# Patient Record
Sex: Female | Born: 1950
Health system: Southern US, Community
[De-identification: ages and names within clinical notes are randomized; demographics above are authoritative.]

## PROBLEM LIST (undated history)

## (undated) DIAGNOSIS — IMO0001 Reserved for inherently not codable concepts without codable children: Secondary | ICD-10-CM

## (undated) DIAGNOSIS — I10 Essential (primary) hypertension: Secondary | ICD-10-CM

## (undated) DIAGNOSIS — Z923 Personal history of irradiation: Secondary | ICD-10-CM

## (undated) DIAGNOSIS — M1990 Unspecified osteoarthritis, unspecified site: Secondary | ICD-10-CM

## (undated) DIAGNOSIS — IMO0002 Reserved for concepts with insufficient information to code with codable children: Secondary | ICD-10-CM

## (undated) DIAGNOSIS — C50211 Malignant neoplasm of upper-inner quadrant of right female breast: Secondary | ICD-10-CM

## (undated) DIAGNOSIS — G629 Polyneuropathy, unspecified: Secondary | ICD-10-CM

## (undated) HISTORY — DX: Malignant neoplasm of upper-inner quadrant of right female breast: C50.211

## (undated) HISTORY — PX: SHOULDER SURGERY: SHX246

## (undated) HISTORY — PX: FOOT SURGERY: SHX648

## (undated) HISTORY — DX: Essential (primary) hypertension: I10

## (undated) HISTORY — DX: Unspecified osteoarthritis, unspecified site: M19.90

## (undated) HISTORY — DX: Reserved for inherently not codable concepts without codable children: IMO0001

## (undated) HISTORY — DX: Reserved for concepts with insufficient information to code with codable children: IMO0002

---

## 2008-09-29 ENCOUNTER — Encounter: Admission: RE | Admit: 2008-09-29 | Discharge: 2008-09-29 | Payer: Self-pay | Admitting: Internal Medicine

## 2008-11-25 ENCOUNTER — Emergency Department (HOSPITAL_COMMUNITY): Admission: EM | Admit: 2008-11-25 | Discharge: 2008-11-25 | Payer: Self-pay | Admitting: Emergency Medicine

## 2010-02-19 ENCOUNTER — Encounter: Payer: Self-pay | Admitting: Internal Medicine

## 2010-05-03 LAB — BASIC METABOLIC PANEL
BUN: 11 mg/dL (ref 6–23)
CO2: 26 mEq/L (ref 19–32)
Calcium: 9.8 mg/dL (ref 8.4–10.5)
Chloride: 104 mEq/L (ref 96–112)
Creatinine, Ser: 0.77 mg/dL (ref 0.4–1.2)
GFR calc Af Amer: >60 mL/min (ref >60)
GFR calc non Af Amer: >60 mL/min (ref >60)
Glucose, Bld: 129 mg/dL — ABNORMAL HIGH (ref 70–99)
Potassium: 3.4 mEq/L — ABNORMAL LOW (ref 3.5–5.1)
Sodium: 138 mEq/L (ref 135–145)

## 2010-05-03 LAB — DIFFERENTIAL
Basophils Absolute: 0 10*3/uL (ref 0.0–0.1)
Basophils Relative: 0 % (ref 0–1)
Eosinophils Absolute: 0 10*3/uL (ref 0.0–0.7)
Eosinophils Relative: 0 % (ref 0–5)
Lymphocytes Relative: 7 % — ABNORMAL LOW (ref 12–46)
Lymphs Abs: 0.5 10*3/uL — ABNORMAL LOW (ref 0.7–4.0)
Monocytes Absolute: 0.1 10*3/uL (ref 0.1–1.0)
Monocytes Relative: 2 % — ABNORMAL LOW (ref 3–12)
Neutro Abs: 6.8 10*3/uL (ref 1.7–7.7)
Neutrophils Relative %: 92 % — ABNORMAL HIGH (ref 43–77)

## 2010-05-03 LAB — CBC
HCT: 41.3 % (ref 36.0–46.0)
Hemoglobin: 14 g/dL (ref 12.0–15.0)
MCHC: 33.9 g/dL (ref 30.0–36.0)
MCV: 91.3 fL (ref 78.0–100.0)
Platelets: 196 10*3/uL (ref 150–400)
RBC: 4.53 MIL/uL (ref 3.87–5.11)
RDW: 13.7 % (ref 11.5–15.5)
WBC: 7.4 10*3/uL (ref 4.0–10.5)

## 2010-05-03 LAB — POCT CARDIAC MARKERS
CKMB, poc: <1 ng/mL — ABNORMAL LOW (ref 1.0–8.0)
Myoglobin, poc: 50.1 ng/mL (ref 12–200)
Troponin i, poc: <0.05 ng/mL (ref 0.00–0.09)

## 2014-09-09 ENCOUNTER — Ambulatory Visit (INDEPENDENT_AMBULATORY_CARE_PROVIDER_SITE_OTHER): Payer: Medicare Other | Admitting: Physician Assistant

## 2014-09-09 VITALS — BP 118/74 | HR 88 | Temp 98.5°F | Resp 16 | Ht 62.0 in | Wt 187.4 lb

## 2014-09-09 DIAGNOSIS — Z111 Encounter for screening for respiratory tuberculosis: Secondary | ICD-10-CM

## 2014-09-09 NOTE — Progress Notes (Signed)
Patient here for TB screen only. Denies a history of positive screen.  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  Philis Fendt, MS, PA-C   10:50 PM, 09/09/2014

## 2014-09-09 NOTE — Progress Notes (Signed)

## 2014-09-12 ENCOUNTER — Ambulatory Visit (INDEPENDENT_AMBULATORY_CARE_PROVIDER_SITE_OTHER): Payer: Medicare Other

## 2014-09-12 DIAGNOSIS — Z111 Encounter for screening for respiratory tuberculosis: Secondary | ICD-10-CM

## 2014-09-12 LAB — TB SKIN TEST
Induration: 0 mm
TB Skin Test: NEGATIVE

## 2014-09-12 NOTE — Progress Notes (Signed)
   Subjective:    Patient ID: Brianna Jones, female    DOB: 02/13/1950, 64 y.o.   MRN: [id number]  HPI Pt. Was here for a PPD reading. Results negative. Induration 0. Pt. Was given a copy of her results for work.    Review of Systems     Objective:   Physical Exam        Assessment & Plan:

## 2014-12-01 ENCOUNTER — Other Ambulatory Visit: Payer: Self-pay

## 2014-12-01 DIAGNOSIS — Z1231 Encounter for screening mammogram for malignant neoplasm of breast: Secondary | ICD-10-CM

## 2014-12-19 ENCOUNTER — Ambulatory Visit
Admission: RE | Admit: 2014-12-19 | Discharge: 2014-12-19 | Disposition: A | Discharge status: Home / Self Care | Payer: Medicare Other | Source: Ambulatory Visit

## 2014-12-19 DIAGNOSIS — Z1231 Encounter for screening mammogram for malignant neoplasm of breast: Secondary | ICD-10-CM

## 2014-12-21 ENCOUNTER — Other Ambulatory Visit: Payer: Self-pay | Admitting: Internal Medicine

## 2014-12-21 DIAGNOSIS — R928 Other abnormal and inconclusive findings on diagnostic imaging of breast: Secondary | ICD-10-CM

## 2014-12-30 ENCOUNTER — Ambulatory Visit
Admission: RE | Admit: 2014-12-30 | Discharge: 2014-12-30 | Disposition: A | Discharge status: Home / Self Care | Payer: Medicare Other | Source: Ambulatory Visit | Attending: Internal Medicine | Admitting: Internal Medicine

## 2014-12-30 ENCOUNTER — Other Ambulatory Visit: Payer: Self-pay | Admitting: Internal Medicine

## 2014-12-30 DIAGNOSIS — R928 Other abnormal and inconclusive findings on diagnostic imaging of breast: Secondary | ICD-10-CM

## 2014-12-30 DIAGNOSIS — R921 Mammographic calcification found on diagnostic imaging of breast: Secondary | ICD-10-CM

## 2014-12-30 DIAGNOSIS — N6310 Unspecified lump in the right breast, unspecified quadrant: Secondary | ICD-10-CM

## 2015-01-06 ENCOUNTER — Ambulatory Visit
Admission: RE | Admit: 2015-01-06 | Discharge: 2015-01-06 | Disposition: A | Discharge status: Home / Self Care | Payer: Medicare Other | Source: Ambulatory Visit | Attending: Internal Medicine | Admitting: Internal Medicine

## 2015-01-06 ENCOUNTER — Other Ambulatory Visit: Payer: Self-pay | Admitting: Internal Medicine

## 2015-01-06 DIAGNOSIS — N6310 Unspecified lump in the right breast, unspecified quadrant: Secondary | ICD-10-CM

## 2015-01-06 DIAGNOSIS — R921 Mammographic calcification found on diagnostic imaging of breast: Secondary | ICD-10-CM

## 2015-01-11 ENCOUNTER — Encounter: Payer: Self-pay | Admitting: *Deleted

## 2015-01-11 ENCOUNTER — Telehealth: Payer: Self-pay | Admitting: *Deleted

## 2015-01-11 DIAGNOSIS — C50211 Malignant neoplasm of upper-inner quadrant of right female breast: Secondary | ICD-10-CM | POA: Insufficient documentation

## 2015-01-11 HISTORY — DX: Malignant neoplasm of upper-inner quadrant of right female breast: C50.211

## 2015-01-11 NOTE — Telephone Encounter (Signed)
Confirmed [facility] for 01/18/15 at 0830 .  Instructions and contact information given. 

## 2015-01-12 ENCOUNTER — Telehealth: Payer: Self-pay | Admitting: *Deleted

## 2015-01-12 NOTE — Telephone Encounter (Signed)
Mailed clinic packet to pt.  

## 2015-01-18 ENCOUNTER — Ambulatory Visit
Admission: RE | Admit: 2015-01-18 | Discharge: 2015-01-18 | Disposition: A | Discharge status: Home / Self Care | Payer: Medicare Other | Source: Ambulatory Visit | Attending: Radiation Oncology | Admitting: Radiation Oncology

## 2015-01-18 ENCOUNTER — Other Ambulatory Visit: Payer: Self-pay | Admitting: *Deleted

## 2015-01-18 ENCOUNTER — Encounter: Payer: Self-pay | Admitting: Skilled Nursing Facility1

## 2015-01-18 ENCOUNTER — Encounter: Payer: Self-pay | Admitting: *Deleted

## 2015-01-18 ENCOUNTER — Encounter: Payer: Self-pay | Admitting: Physical Therapy

## 2015-01-18 ENCOUNTER — Other Ambulatory Visit (HOSPITAL_BASED_OUTPATIENT_CLINIC_OR_DEPARTMENT_OTHER): Payer: Medicare Other

## 2015-01-18 ENCOUNTER — Other Ambulatory Visit: Payer: Self-pay | Admitting: Surgery

## 2015-01-18 ENCOUNTER — Encounter: Payer: Self-pay | Admitting: Nurse Practitioner

## 2015-01-18 ENCOUNTER — Encounter: Payer: Self-pay | Admitting: Hematology and Oncology

## 2015-01-18 ENCOUNTER — Ambulatory Visit (HOSPITAL_BASED_OUTPATIENT_CLINIC_OR_DEPARTMENT_OTHER): Payer: Medicare Other | Admitting: Hematology and Oncology

## 2015-01-18 ENCOUNTER — Ambulatory Visit: Payer: Medicare Other | Attending: Surgery | Admitting: Physical Therapy

## 2015-01-18 VITALS — BP 153/81 | HR 107 | Temp 98.3°F | Resp 20 | Ht 62.0 in | Wt 189.9 lb

## 2015-01-18 DIAGNOSIS — C50211 Malignant neoplasm of upper-inner quadrant of right female breast: Secondary | ICD-10-CM

## 2015-01-18 DIAGNOSIS — C50412 Malignant neoplasm of upper-outer quadrant of left female breast: Secondary | ICD-10-CM

## 2015-01-18 DIAGNOSIS — C50911 Malignant neoplasm of unspecified site of right female breast: Secondary | ICD-10-CM | POA: Diagnosis present

## 2015-01-18 DIAGNOSIS — C50912 Malignant neoplasm of unspecified site of left female breast: Secondary | ICD-10-CM | POA: Diagnosis present

## 2015-01-18 DIAGNOSIS — R293 Abnormal posture: Secondary | ICD-10-CM | POA: Insufficient documentation

## 2015-01-18 LAB — COMPREHENSIVE METABOLIC PANEL
ALT: 22 U/L (ref 0–55)
AST: 25 U/L (ref 5–34)
Albumin: 3.7 g/dL (ref 3.5–5.0)
Alkaline Phosphatase: 75 U/L (ref 40–150)
Anion Gap: 9 mEq/L (ref 3–11)
BUN: 11.8 mg/dL (ref 7.0–26.0)
CO2: 28 mEq/L (ref 22–29)
Calcium: 10 mg/dL (ref 8.4–10.4)
Chloride: 103 mEq/L (ref 98–109)
Creatinine: 0.9 mg/dL (ref 0.6–1.1)
EGFR: 83 mL/min/{1.73_m2} — ABNORMAL LOW (ref >90)
Glucose: 95 mg/dl (ref 70–140)
Potassium: 3.6 mEq/L (ref 3.5–5.1)
Sodium: 140 mEq/L (ref 136–145)
Total Bilirubin: 0.35 mg/dL (ref 0.20–1.20)
Total Protein: 7.8 g/dL (ref 6.4–8.3)

## 2015-01-18 LAB — CBC WITH DIFFERENTIAL/PLATELET
BASO%: 0.3 % (ref 0.0–2.0)
Basophils Absolute: 0 10*3/uL (ref 0.0–0.1)
EOS%: 2.9 % (ref 0.0–7.0)
Eosinophils Absolute: 0.2 10*3/uL (ref 0.0–0.5)
HCT: 42 % (ref 34.8–46.6)
HGB: 14 g/dL (ref 11.6–15.9)
LYMPH%: 44.4 % (ref 14.0–49.7)
MCH: 29.5 pg (ref 25.1–34.0)
MCHC: 33.3 g/dL (ref 31.5–36.0)
MCV: 88.4 fL (ref 79.5–101.0)
MONO#: 0.3 10*3/uL (ref 0.1–0.9)
MONO%: 4.8 % (ref 0.0–14.0)
NEUT#: 2.8 10*3/uL (ref 1.5–6.5)
NEUT%: 47.6 % (ref 38.4–76.8)
Platelets: 221 10*3/uL (ref 145–400)
RBC: 4.75 10*6/uL (ref 3.70–5.45)
RDW: 15 % — ABNORMAL HIGH (ref 11.2–14.5)
WBC: 5.9 10*3/uL (ref 3.9–10.3)
lymph#: 2.6 10*3/uL (ref 0.9–3.3)

## 2015-01-18 NOTE — Assessment & Plan Note (Signed)
RIGHT breast biopsy 01/06/2015: 12:30: Invasive lobular carcinoma grade 1, ER 95%, PR 0% insufficient for her to our Ki-67; LCIS with necrosis and calcifications; LEFT breast biopsy upper outer quadrant: DCIS with necrosis and calcifications, ER 100%, PR 80% Right breast mass at 12:30: 1 x 0.6 x 1.1 cm; left breast calcifications spanning 4 cm with a titer group measuring 5 mm, no pathologic lymph nodes Clinical stage: Tis N0 stage 0 in the left breast  Pathology review: I discussed with the patient the difference between DCIS and invasive breast cancer. It is considered a precancerous lesion. DCIS is classified as a 0. It is generally detected through mammograms as calcifications. We discussed the significance of grades and its impact on prognosis. We also discussed the importance of ER and PR receptors and their implications to adjuvant treatment options. Prognosis of DCIS dependence on grade, comedo necrosis. It is anticipated that if not treated, 20-30% of DCIS can develop into invasive breast cancer.  Recommendation: Breast MRI 1. Breast conserving surgeries bilaterally 2. Followed by adjuvant radiation therapy 3. Followed by antiestrogen therapy with tamoxifen 5 years  Tamoxifen counseling: We discussed the risks and benefits of tamoxifen. These include but not limited to insomnia, hot flashes, mood changes, vaginal dryness, and weight gain. Although rare, serious side effects including endometrial cancer, risk of blood clots were also discussed. We strongly believe that the benefits far outweigh the risks. Patient understands these risks and consented to starting treatment. Planned treatment duration is 5 years.  Return to clinic after surgery to discuss the final pathology report and come up with an adjuvant treatment plan.

## 2015-01-18 NOTE — Progress Notes (Signed)
Subjective:     Patient ID: Brianna Jones, female   DOB: 01-Apr-1950, 64 y.o.   MRN: [id number]  HPI   Review of Systems     Objective:   Physical Exam For the patient to understand and be given the tools to implement a healthy plant based diet during their cancer diagnosis.     Assessment:     Patient was seen today and found to be in good spirits and accompanied by her daughter. Pts ht 5'2'', wt 189 pounds, and BMI 34.8. 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      Plan:     Dietitian educated the patient on implementing a plant based diet by incorporating more plant proteins, fruits, and vegetables. As a part of a healthy routine physical activity was discussed. The importance of legitimate, evidence based information was discussed and examples were given. A folder of evidence based information with a focus on a plant based diet and general nutrition during cancer was given to the patient.  As a part of the continuum of care the cancer dietitian's contact information was given to the patient in the event they would like to have a follow up appointment.

## 2015-01-18 NOTE — Progress Notes (Signed)
Ms. Brianna Jones is a very pleasant 64 y.o. 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  Biopsy results revealed the right sided tumor's prognostic profile is ER positive, PR negative, with insufficinet tissue to evaluate HER2/neu.  Left sided tumor is ER and PR positive.   She presents today with her daughter to the [facility]) for treatment consideration and recommendations from the breast surgeon, radiation oncologist, and medical oncologist.     I briefly met with Ms. Brianna Jones and her daughter during her [facility] visit today. We discussed the purpose of the Survivorship Clinic, which will include monitoring for recurrence, coordinating completion of age and gender-appropriate cancer screenings, promotion of overall wellness, as well as managing potential late/long-term side effects of anti-cancer treatments.    The treatment plan for Ms. Brianna Jones will likely include bilateral surgery, radiation therapy, and anti-estrogen therapy.  As of today, the intent of treatment for Ms. Brianna Jones is cure, therefore she will be eligible for the Survivorship Clinic upon her completion of treatment.  Her survivorship care plan (SCP) document will be drafted and updated throughout the course of her treatment trajectory. She will receive the SCP in an office visit with myself in the Survivorship Clinic once she has completed treatment.   Ms. Brianna Jones was encouraged to ask questions and all questions were answered to her satisfaction.  She was given my business card and encouraged to contact me with any concerns regarding survivorship.  I look forward to participating in her care.   Kenn File, [place] [phone]

## 2015-01-18 NOTE — Patient Instructions (Signed)

## 2015-01-18 NOTE — Therapy (Signed)
[place], [place], [address] Phone: [phone]   Fax:  [phone]  Physical Therapy Evaluation  Patient Details  Name: Brianna Jones MRN: [id number] Date of Birth: April 16, 1950 Referring Provider: Dr. Alphonsa Overall  Encounter Date: 01/18/2015      PT End of Session - 01/18/15 1135    Visit Number 1   Number of Visits 1   PT Start Time 1000   PT Stop Time 1013  Also saw pt from 1057-1110   PT Time Calculation (min) 13 min   Activity Tolerance Patient tolerated treatment well   Behavior During Therapy [facility] for tasks assessed/performed      Past Medical History  Diagnosis Date  . Hypertension   . Breast cancer of upper-inner quadrant of right female breast ([place]) 01/11/2015  . Arthritis     Past Surgical History  Procedure Laterality Date  . Shoulder surgery      There were no vitals filed for this visit.  Visit Diagnosis:  Bilateral malignant neoplasm of breast in female, unspecified site of breast ([place])  Abnormal posture      Subjective Assessment - 01/18/15 1135    Pertinent History Patient was diagnosed on 12/19/14 with bilateral breast cancer located in the upper outer quadrant.  Her right breast mass is invasive lobular carcinoma, measures 5 mm and is ER positive and PR negative.  Her left breast mass is high grade DCIS and is ER/PR positive.            [facility] PT Assessment - 01/18/15 0001    Assessment   Medical Diagnosis Bilateral breast cancer   Referring Provider Dr. Alphonsa Overall   Onset Date/Surgical Date 12/19/14   Hand Dominance Right   Prior Therapy none   Precautions   Precautions Other (comment)  Active breast cancer   Restrictions   Weight Bearing Restrictions No   Balance Screen   Has the patient fallen in the past 6 months No   Has the patient had a decrease in activity level because of a fear of falling?  No   Is the patient reluctant to leave their  home because of a fear of falling?  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 These included active assist shoulder flexion in sitting, scapular retraction, wall walking with shoulder abduction, and hands behind head external rotation.  She was encouraged to do these twice a day, holding 3 seconds and repeating 5 times when permitted by her physician.         PT Education - 01/18/15 1134    Education provided Yes   Education Details  Lymphedema risk reduction and post op shoulder ROM HEP   Person(s) Educated Patient   Methods Explanation;Demonstration;Handout   Comprehension Returned demonstration;Verbalized understanding              Breast Clinic Goals - February 08, 2015 May 21, 1215    Patient will be able to verbalize understanding of pertinent lymphedema risk reduction practices relevant to her diagnosis specifically related to skin care.   Time 1   Period Days   Status Achieved   Patient will be able to return demonstrate and/or verbalize understanding of the post-op home exercise program related to regaining shoulder range of motion.   Time 1   Period Days   Status Achieved   Patient will be able to verbalize  understanding of the importance of attending the postoperative After Breast Cancer Class for further lymphedema risk reduction education and therapeutic exercise.   Time 1   Period Days   Status Achieved              Plan - 02-08-2015 1136    Clinical Impression Statement Patient was diagnosed on 12/19/14 with bilateral breast cancer located in the upper outer quadrant.  Her right breast mass is invasive lobular carcinoma, measures 5 mm and is ER positive and PR negative.  Her left breast mass is high grade DCIS and is ER/PR positive.  She is planning to have a breast MRI.  She will likely undergo a bilateral lumpectomy with a bilateral sentinel node biopsy followed by bilateral radiation.  She will undergo anti-estrogen therapy.  She may benefit from post op PT to regain shoulder ROM and reduce lymphedema risk.   Pt will benefit from skilled therapeutic intervention in order to improve on the following deficits Decreased strength;Pain;Decreased knowledge of precautions;Impaired UE functional use;Decreased range of motion   Rehab Potential Excellent   Clinical Impairments Affecting Rehab Potential none   PT Frequency One time visit   PT Treatment/Interventions Therapeutic exercise;Patient/family education   PT Next Visit Plan N/A   Consulted and Agree with Plan of Care Patient     Patient will follow up at outpatient cancer rehab if needed following surgery.  If the patient requires physical therapy at that time, a specific plan will be dictated and sent to the referring physician for approval. The patient was educated today on appropriate basic range of motion exercises to begin post operatively and the importance of attending the After Breast Cancer class following surgery.  Patient was educated today on lymphedema risk reduction practices as it pertains to recommendations that will benefit the patient immediately following surgery.  She verbalized good understanding.  No additional physical  therapy is indicated at this time.         G-Codes - 02-08-2015 05/21/15    Functional Assessment Tool Used Clinical Judgement   Functional Limitation Other PT primary   Other PT Primary Current Status [id number]) At least 1 percent but less than 20 percent impaired, limited or restricted   Other PT Primary Goal Status [id number]) At least 1 percent but less than 20 percent impaired, limited or restricted   Other PT Primary Discharge Status [id number]) At least 1 percent but less than 20 percent impaired, limited or restricted       Problem List Patient Active Problem List   Diagnosis Date Noted  . Breast cancer of upper-outer quadrant of left female breast ([place]) Feb 08, 2015  . Breast cancer of upper-inner quadrant of right female breast ([place]) 01/11/2015    Annia Friendly, PT 2015-02-08 12:20  PM  [place], [place], [address] Phone: [phone]   Fax:  [phone]  Name: Brianna Jones MRN: [id number] Date of Birth: [birth year]

## 2015-01-18 NOTE — Progress Notes (Signed)
Radiation Oncology         [phone] ________________________________  Name: ALEXSYS ESKIN MRN: [id number]  Date: 01/18/2015  DOB: 03-11-1950     PJ:[email or web address],IWPYK N, MD  Alphonsa Overall, MD     REFERRING PHYSICIAN: Alphonsa Overall, MD  DIAGNOSIS: The primary encounter diagnosis was Breast cancer of upper-inner quadrant of right female breast ([place]). A diagnosis of Breast cancer of upper-outer quadrant of left female breast [facility]) was also pertinent to this visit.  HISTORY OF PRESENT ILLNESS::Brianna Jones is a 64 y.o. female who is seen for an initial consultation visit regarding the patient's diagnosis of breast cancer.  The patient was found to have suspicious findings within the right and left breasts on initial mammogram. The patient has not had symptoms prior to this study: caught on screening mammogram. A diagnostic mammogram and breast ultrasound confirmed this finding. On ultrasound, the tumor measured 1.2 cm and was present in the upper inner quadrant. Left breast DCIS calcifications span an area of 4 cm.   A biopsy was performed on both breasts. This revealed (T1c) invasive lobular in the right and ([place]) DCIS in the left. Receptors studies were completed and indicate that the tumor is estrogen receptor positive in the right and left:, progesterone receptor positive in the left. The Ki-67 staining was insufficient for the right.   The patient has not undergone an MRI scan of the breasts: an MRI will be ordered.  Denies nipple discharge, bleeding or pain after biopsy.   Patient is a Training and development officer. Patient menarche at age 63 Post-menopausal Does not use hormone replacement GxP2, first live birth at 40 Has used birth control pill (age 89-45) Colonoscopy in 2008 Bone Density in December 2016 Last PAP smear 2015 First Mammogram at ~ age 17 No family history of breast cancer  PREVIOUS RADIATION THERAPY: No  PAST MEDICAL HISTORY:  has a past medical history of Hypertension;  Breast cancer of upper-inner quadrant of right female breast ([place]) (01/11/2015); and Arthritis.    PAST SURGICAL HISTORY: Past Surgical History  Procedure Laterality Date  . Shoulder surgery      FAMILY HISTORY: family history includes Diabetes in her brother and mother; Heart disease in her father and mother; Hyperlipidemia in her mother; Hypertension in her father and mother.   SOCIAL HISTORY:  reports that she has never smoked. She does not have any smokeless tobacco history on file. She reports that she drinks alcohol. She reports that she does not use illicit drugs.   ALLERGIES: Review of patient's allergies indicates no known allergies.   MEDICATIONS:  Current Outpatient Prescriptions  Medication Sig Dispense Refill  . AMLODIPINE BESYLATE PO Take 10 mg by mouth.    . Cholecalciferol (VITAMIN D3) 5000 UNITS CAPS Take by mouth.    . cyclobenzaprine (FLEXERIL) 10 MG tablet Take 10 mg by mouth 3 (three) times daily as needed.  6  . gabapentin (NEURONTIN) 300 MG capsule TAKE 1 CAP BY MOUTH IN EVENING X 1 WEEK, THEN 2 CAPS X 1 WEEK THEN 1 IN AM AND 2 IN EVENING  6  . HYDROcodone-acetaminophen (NORCO/VICODIN) 5-325 MG tablet Take 1 tablet by mouth every 8 (eight) hours as needed.  0  . losartan-hydrochlorothiazide (HYZAAR) 100-25 MG per tablet Take 1 tablet by mouth daily.    . Multiple Vitamin (MULTIVITAMIN) tablet Take 1 tablet by mouth daily.    . naproxen sodium (ANAPROX) 550 MG tablet Take 550 mg by mouth 2 (two) times daily with a meal.    .  omega-3 acid ethyl esters (LOVAZA) 1 G capsule Take by mouth 2 (two) times daily.    . Probiotic Product (PROBIOTIC DAILY PO) Take by mouth.    Marland Kitchen tiZANidine (ZANAFLEX) 4 MG tablet Take by mouth at bedtime.  6   No current facility-administered medications for this encounter.   REVIEW OF SYSTEMS:  A 15 point review of systems is documented in the electronic medical record. This was obtained by the nursing staff. However, I reviewed this with  the patient to discuss relevant findings and make appropriate changes.  Pertinent items are noted in HPI.   PHYSICAL EXAM:  Vitals with Age-Percentiles 01/18/2015  Length 412.8 cm  Systolic 786  Diastolic 81  Pulse 767  Respiration 20  Weight 86.138 kg  BMI 34.8    ECOG = 1   General: Well-developed, in no acute distress HEENT: Normocephalic, atraumatic; oral cavity clear Neck: Supple without any lymphadenopathy Cardiovascular: Regular rate and rhythm Respiratory: Clear to auscultation bilaterally Breasts: Left breast large, pendulous. Mild bruising in UOQ from recent biopsy. Right breast is large and pendulous, bruising noted in upper inner aspect from recent biopsy. No dominant mass in either breast GI: Soft, nontender, normal bowel sounds Extremities: No edema present Neuro: No focal deficits No palpable cervical, supraclavicular or axillary lymphoadenopathy  LABORATORY DATA:  Lab Results  Component Value Date   WBC 5.9 01/18/2015   HGB 14.0 01/18/2015   HCT 42.0 01/18/2015   MCV 88.4 01/18/2015   PLT 221 01/18/2015   Lab Results  Component Value Date   NA 140 01/18/2015   K 3.6 01/18/2015   CL 104 11/25/2008   CO2 28 01/18/2015   Lab Results  Component Value Date   ALT 22 01/18/2015   AST 25 01/18/2015   ALKPHOS 75 01/18/2015   BILITOT 0.35 01/18/2015      RADIOGRAPHY: Mm Digital Diagnostic Bilat  01/06/2015  CLINICAL DATA:  Status post bilateral breast biopsies. EXAM: DIAGNOSTIC BILATERAL MAMMOGRAM POST ULTRASOUND BIOPSY RIGHT BREAST AND STEREOTACTIC GUIDED CORE BIOPSY LEFT BREAST COMPARISON:  Previous exam(s). FINDINGS: Mammographic images were obtained following ultrasound guided biopsy of mass in the 12:30 o'clock location of the right breast. A ribbon shaped clip is identified within the mass in the upper inner quadrant of the right breast. Within the upper-outer quadrant of the left breast there is a coil shaped clip in the anterior aspect of a linear  group of calcifications. IMPRESSION: Tissue marker clips are in expected locations following biopsies. Final Assessment: Post Procedure Mammograms for Marker Placement Electronically Signed   By: Nolon Nations M.D.   On: 01/06/2015 11:06   US Breast Ltd Uni Right Inc Axilla  12/30/2014  CLINICAL DATA:  Recall from screening mammography, possible mass in the upper inner right breast and calcifications in the upper outer left breast. EXAM: DIGITAL DIAGNOSTIC BILATERAL MAMMOGRAM WITH 3D TOMOSYNTHESIS WITH CAD ULTRASOUND RIGHT BREAST COMPARISON:  Mammography 12/19/2014, 09/29/2008, 08/07/2007. No prior ultrasound. ACR Breast Density Category a: The breast tissue is almost entirely fatty. FINDINGS: CC and MLO views of the right breast were obtained with tomosynthesis. These confirm an approximate 12 mm mass in the upper inner quadrant with irregular margins, associated with mild architectural distortion and containing suspicious calcifications. No suspicious findings elsewhere in the right breast. Spot magnification CC and mediolateral views of the calcifications in the upper outer left breast, middle depth, were obtained. These confirm a loose group of calcifications spanning a total of approximately 4 cm which are in a  linear configuration directed toward the nipple and associated with slight increased density. There is a tighter group of pleomorphic calcifications at the anterior margin of the loose group spanning approximately 5 mm. Mammographic images were processed with CAD. On physical exam, there is palpable thickening in the upper inner right breast between 12 o'clock and 1 o'clock approximately 7 cm from the nipple. Targeted right breast ultrasound is performed, showing a heterogeneous but predominantly hypoechoic mass with irregular margins, containing calcifications, demonstrating acoustic shadowing, with internal color Doppler flow at the 12:30 o'clock position approximately 7 cm from the nipple  measuring approximately 1.0 x 0.6 x 1.1 cm. Sonographic evaluation of the right axilla demonstrates no pathologic lymphadenopathy. IMPRESSION: 1. Highly suspicious approximate 1.1 cm mass in the upper inner right breast. 2. No pathologic right axillary lymphadenopathy. 3. Suspicious calcifications in the upper outer left breast, middle depth. RECOMMENDATION: 1. Ultrasound-guided core needle biopsy of the right breast mass. 2. Stereotactic core needle biopsy of the calcifications in the left breast. The ultrasound and stereotactic core biopsy procedures were discussed with the patient and her questions were answered. She has agreed to proceed and these have been scheduled for Friday, December 9, at 9 o'clock and 10 o'clock a.m. I have discussed the findings and recommendations with the patient. Results were also provided in writing at the conclusion of the visit. BI-RADS CATEGORY  5: Highly suggestive of malignancy. Electronically Signed   By: Evangeline Dakin M.D.   On: 12/30/2014 16:53   Mm Diag Breast Tomo Bilateral  12/30/2014  CLINICAL DATA:  Recall from screening mammography, possible mass in the upper inner right breast and calcifications in the upper outer left breast. EXAM: DIGITAL DIAGNOSTIC BILATERAL MAMMOGRAM WITH 3D TOMOSYNTHESIS WITH CAD ULTRASOUND RIGHT BREAST COMPARISON:  Mammography 12/19/2014, 09/29/2008, 08/07/2007. No prior ultrasound. ACR Breast Density Category a: The breast tissue is almost entirely fatty. FINDINGS: CC and MLO views of the right breast were obtained with tomosynthesis. These confirm an approximate 12 mm mass in the upper inner quadrant with irregular margins, associated with mild architectural distortion and containing suspicious calcifications. No suspicious findings elsewhere in the right breast. Spot magnification CC and mediolateral views of the calcifications in the upper outer left breast, middle depth, were obtained. These confirm a loose group of calcifications  spanning a total of approximately 4 cm which are in a linear configuration directed toward the nipple and associated with slight increased density. There is a tighter group of pleomorphic calcifications at the anterior margin of the loose group spanning approximately 5 mm. Mammographic images were processed with CAD. On physical exam, there is palpable thickening in the upper inner right breast between 12 o'clock and 1 o'clock approximately 7 cm from the nipple. Targeted right breast ultrasound is performed, showing a heterogeneous but predominantly hypoechoic mass with irregular margins, containing calcifications, demonstrating acoustic shadowing, with internal color Doppler flow at the 12:30 o'clock position approximately 7 cm from the nipple measuring approximately 1.0 x 0.6 x 1.1 cm. Sonographic evaluation of the right axilla demonstrates no pathologic lymphadenopathy. IMPRESSION: 1. Highly suspicious approximate 1.1 cm mass in the upper inner right breast. 2. No pathologic right axillary lymphadenopathy. 3. Suspicious calcifications in the upper outer left breast, middle depth. RECOMMENDATION: 1. Ultrasound-guided core needle biopsy of the right breast mass. 2. Stereotactic core needle biopsy of the calcifications in the left breast. The ultrasound and stereotactic core biopsy procedures were discussed with the patient and her questions were answered. She has agreed to proceed and  these have been scheduled for Friday, December 9, at 9 o'clock and 10 o'clock a.m. I have discussed the findings and recommendations with the patient. Results were also provided in writing at the conclusion of the visit. BI-RADS CATEGORY  5: Highly suggestive of malignancy. Electronically Signed   By: Evangeline Dakin M.D.   On: 12/30/2014 16:53   Mm Lt Breast Bx W Loc Dev 1st Lesion Image Bx Spec Stereo Guide  01/16/2015  ADDENDUM REPORT: 01/10/2015 14:57 ADDENDUM: Pathology reveals Grade I INVASIVE LOBULAR CARCINOMA, LOBULAR  CARCINOMA IN SITU WITH NECROSIS AND CALCIFICATIONS of the Right breast at the 12:30 o'clock location. Intermediate to High grade DUCTAL CARCINOMA IN SITU WITH NECROSIS AND CALCIFICATIONS of the upper outer quadrant of the Left breast. This was to be concordant by Dr. Shon Hale. Pathology results were discussed with the patient via telephone. She reported tenderness at the biopsy sites and is doing well otherwise. Post biopsy care and instructions were reviewed and questions were answered. She was encouraged to contact [facility] with any additional questions and or concerns. She was referred to [facility] at [facility] of January 18, 2015. Pathology results reported by Terie Purser RN on January 10, 2015. Electronically Signed   By: Nolon Nations M.D.   On: 01/10/2015 14:57  01/16/2015  CLINICAL DATA:  Patient presents for stereotactic guided core biopsy of left breast calcifications. EXAM: LEFT BREAST STEREOTACTIC CORE NEEDLE BIOPSY COMPARISON:  Previous exams. FINDINGS: The patient and I discussed the procedure of stereotactic-guided biopsy including benefits and alternatives. We discussed the high likelihood of a successful procedure. We discussed the risks of the procedure including infection, bleeding, tissue injury, clip migration, and inadequate sampling. Informed written consent was given. The usual time out protocol was performed immediately prior to the procedure. Using sterile technique and 1% lidocaine as local anesthetic, under stereotactic guidance, a 9 gauge vacuum assisted device was used to perform core needle biopsy of calcifications in the upper-outer quadrant of the left breast using a cephalad approach. Specimen radiograph was performed showing calcifications to be present. Specimens with calcifications are identified for pathology. At the conclusion of the procedure, a coil show a tissue marker  clip was deployed into the biopsy cavity. Follow-up 2-view mammogram was performed and dictated separately. IMPRESSION: Stereotactic-guided biopsy of left breast calcifications. No apparent complications. Electronically Signed: By: Nolon Nations M.D. On: 01/06/2015 11:05   [place] Rt Breast Bx W Loc Dev 1st Lesion Img Bx Spec US Guide  01/16/2015  ADDENDUM REPORT: 01/16/2015 14:21 ADDENDUM: Pathology reveals Grade I INVASIVE LOBULAR CARCINOMA, LOBULAR CARCINOMA IN SITU WITH NECROSIS AND CALCIFICATIONS of the Right breast at the 12:30 o'clock location. Intermediate to High grade DUCTAL CARCINOMA IN SITU WITH NECROSIS AND CALCIFICATIONS of the upper outer quadrant of the Left breast. This was to be concordant by Dr. Shon Hale. Pathology results were discussed with the patient via telephone. She reported tenderness at the biopsy sites and is doing well otherwise. Post biopsy care and instructions were reviewed and questions were answered. She was encouraged to contact [facility] with any additional questions and or concerns. She was referred to [facility] at [facility] of January 18, 2015. Pathology results reported by Terie Purser RN on January 10, 2015. Electronically Signed   By: Nolon Nations M.D.   On: 01/16/2015 14:21  01/16/2015  CLINICAL DATA:  Patient presents for ultrasound-guided core biopsy of right breast mass. EXAM: ULTRASOUND GUIDED RIGHT BREAST CORE NEEDLE BIOPSY COMPARISON:  Previous exam(s). FINDINGS: I met with the patient and we discussed the procedure of ultrasound-guided biopsy, including benefits and alternatives. We discussed the high likelihood of a successful procedure. We discussed the risks of the procedure, including infection, bleeding, tissue injury, clip migration, and inadequate sampling. Informed written consent was given. The usual time-out protocol was performed immediately prior to  the procedure. Using sterile technique and 2% Lidocaine as local anesthetic, under direct ultrasound visualization, a 14 gauge spring-loaded device was used to perform biopsy of mass in the 12:30 o'clock location of the right breast using a caudal approach. At the conclusion of the procedure a ribbon shaped tissue marker clip was deployed into the biopsy cavity. Follow up 2 view mammogram was performed and dictated separately. IMPRESSION: Ultrasound guided biopsy of right breast mass. No apparent complications. Electronically Signed: By: Nolon Nations M.D. On: 01/06/2015 10:13   IMPRESSION:    Breast cancer of upper-inner quadrant of right female breast ([place])   12/30/2014 Mammogram Right breast mass at 12:30: 1 x 0.6 x 1.1 cm; left breast calcifications spanning 4 cm with a titer group measuring 5 mm, no pathologic lymph nodes   01/06/2015 Initial Diagnosis RIGHT breast biopsy: 12:30: Invasive lobular carcinoma grade 1, ER 95%, PR 0% insufficient for her to our Ki-67; LCIS with necrosis and calcifications; LEFT breast biopsy upper outer quadrant: DCIS with necrosis and calcifications, ER 100%, PR 80%    The patient has a recent diagnosis of grade I invasive lobular carcinoma of the right breast and DCIS of the left breast. She appears to be a good candidate for breast conservation treatment. I discussed with the patient the option of bilateral lumpectomy with plastic reduction in order to facilitate radiation treatment.  I discussed with the patient the role of adjuvant radiation treatment in this setting. We discussed the potential benefit of radiation treatment, especially with regards to local control of the patient's tumor. We also discussed the possible side effects and risks of such a treatment as well.  All of the patient's questions were answered. The patient wishes to proceed with radiation treatment at the appropriate time.  PLAN: I look forward to seeing the patient postoperatively to  review her case and further discuss and coordinate an anticipated course of radiation treatment.        -----------------------------------  Blair Promise, PhD, MD     This document serves as a record of services personally performed by Gery Pray, MD. It was created on his behalf by Derek Mound, a trained medical scribe. The creation of this record is based on the scribe's personal observations and the provider's statements to them. This document has been checked and approved by the attending provider.

## 2015-01-18 NOTE — Progress Notes (Signed)
[place] NOTE  Patient Care Team: Glendale Chard, MD as PCP - General (Internal Medicine) Alphonsa Overall, MD as Consulting Physician (General Surgery) Nicholas Lose, MD as Consulting Physician (Hematology and Oncology) Gery Pray, MD as Consulting Physician (Radiation Oncology)  CHIEF COMPLAINTS/PURPOSE OF CONSULTATION:  Newly diagnosed breast cancer  HISTORY OF PRESENTING ILLNESS:  Brianna Jones 64 y.o. female is here because of recent diagnosis of right breast invasive lobular cancer with LCIS and left breast DCIS. She had a routine screening mammogram that revealed a right breast mass at 12:30 position measuring 1.1 cm. In the left fascia calcification the span 4 cm with a titer group of calcification measuring 5 mm. Right breast biopsy revealed LCIS with a microscopic focus of invasive lobular cancer that was ER positive PR negative. There was insufficient to mark to do HER-2/neu testing or Ki-67. Left breast biopsy revealed intermediate grade DCIS that was ER/PR positive. She was presented at the multidisciplinary tumor board and she is here at the [facility] clinic to discuss a treatment plan accompanied by her daughter. 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 Her daughter lives in [place].  I reviewed her records extensively and collaborated the history with the patient.  SUMMARY OF ONCOLOGIC HISTORY:   Breast cancer of upper-inner quadrant of right female breast ([place])   12/30/2014 Mammogram Right breast mass at 12:30: 1 x 0.6 x 1.1 cm; left breast calcifications spanning 4 cm with a titer group measuring 5 mm, no pathologic lymph nodes   01/06/2015 Initial Diagnosis RIGHT breast biopsy: 12:30: Invasive lobular carcinoma grade 1, ER 95%, PR 0% insufficient for her to our Ki-67; LCIS with necrosis and calcifications; LEFT breast biopsy upper outer quadrant: DCIS with necrosis and calcifications, ER 100%, PR 80%    In terms of  breast cancer risk profile:  She menarched at early age of 36 and went to menopause at age 17  She had 2 pregnancy, her first child was born at age 46  She has received birth control pills for approximately 15 years.  She was never exposed to fertility medications or hormone replacement therapy.  She has no family history of Breast/GYN/GI cancer  MEDICAL HISTORY:  Past Medical History  Diagnosis Date  . Hypertension   . Breast cancer of upper-inner quadrant of right female breast ([place]) 01/11/2015  . Arthritis     SURGICAL HISTORY: Past Surgical History  Procedure Laterality Date  . Shoulder surgery      SOCIAL HISTORY: Social History   Social History  . Marital Status: Unknown    Spouse Name: N/A  . Number of Children: N/A  . Years of Education: N/A   Occupational History  . Not on file.   Social History Main Topics  . Smoking status: Never Smoker   . Smokeless tobacco: Not on file  . Alcohol Use: 0.0 oz/week    0 Standard drinks or equivalent per week     Comment: 2-3  . Drug Use: No  . Sexual Activity: Not on file   Other Topics Concern  . Not on file   Social History Narrative    FAMILY HISTORY: Family History  Problem Relation Age of Onset  . Diabetes Mother   . Heart disease Mother   . Hyperlipidemia Mother   . Hypertension Mother   . Heart disease Father   . Hypertension Father   . Diabetes Brother     ALLERGIES:  has No Known  Allergies.  MEDICATIONS:  Current Outpatient Prescriptions  Medication Sig Dispense Refill  . AMLODIPINE BESYLATE PO Take 10 mg by mouth.    . Cholecalciferol (VITAMIN D3) 5000 UNITS CAPS Take by mouth.    . cyclobenzaprine (FLEXERIL) 10 MG tablet Take 10 mg by mouth 3 (three) times daily as needed.  6  . gabapentin (NEURONTIN) 300 MG capsule TAKE 1 CAP BY MOUTH IN EVENING X 1 WEEK, THEN 2 CAPS X 1 WEEK THEN 1 IN AM AND 2 IN EVENING  6  . HYDROcodone-acetaminophen (NORCO/VICODIN) 5-325 MG tablet Take 1 tablet by mouth  every 8 (eight) hours as needed.  0  . losartan-hydrochlorothiazide (HYZAAR) 100-25 MG per tablet Take 1 tablet by mouth daily.    . Multiple Vitamin (MULTIVITAMIN) tablet Take 1 tablet by mouth daily.    . naproxen sodium (ANAPROX) 550 MG tablet Take 550 mg by mouth 2 (two) times daily with a meal.    . omega-3 acid ethyl esters (LOVAZA) 1 G capsule Take by mouth 2 (two) times daily.    . Probiotic Product (PROBIOTIC DAILY PO) Take by mouth.    Marland Kitchen tiZANidine (ZANAFLEX) 4 MG tablet Take by mouth at bedtime.  6   No current facility-administered medications for this visit.    REVIEW OF SYSTEMS:   Constitutional: Denies fevers, chills or abnormal night sweats Eyes: Denies blurriness of vision, double vision or watery eyes Ears, nose, mouth, throat, and face: Denies mucositis or sore throat Respiratory: Denies cough, dyspnea or wheezes Cardiovascular: Denies palpitation, chest discomfort or lower extremity swelling Gastrointestinal:  Denies nausea, heartburn or change in bowel habits Skin: Denies abnormal skin rashes Lymphatics: Denies new lymphadenopathy or easy bruising Neurological:Denies numbness, tingling or new weaknesses Behavioral/Psych: Mood is stable, no new changes  Breast:  Denies any palpable lumps or discharge All other systems were reviewed with the patient and are negative.  PHYSICAL EXAMINATION: ECOG PERFORMANCE STATUS: 1 - Symptomatic but completely ambulatory  Filed Vitals:   01/18/15 0841  BP: 153/81  Pulse: 107  Temp: 98.3 F (36.8 C)  Resp: 20   Filed Weights   01/18/15 0841  Weight: 189 lb 14.4 oz (86.138 kg)    GENERAL:alert, no distress and comfortable SKIN: skin color, texture, turgor are normal, no rashes or significant lesions EYES: normal, conjunctiva are pink and non-injected, sclera clear OROPHARYNX:no exudate, no erythema and lips, buccal mucosa, and tongue normal  NECK: supple, thyroid normal size, non-tender, without nodularity LYMPH:  no  palpable lymphadenopathy in the cervical, axillary or inguinal LUNGS: clear to auscultation and percussion with normal breathing effort HEART: regular rate & rhythm and no murmurs and no lower extremity edema ABDOMEN:abdomen soft, non-tender and normal bowel sounds Musculoskeletal:no cyanosis of digits and no clubbing  PSYCH: alert & oriented x 3 with fluent speech NEURO: no focal motor/sensory deficits BREAST: No palpable nodules in breast. No palpable axillary or supraclavicular lymphadenopathy (exam performed in the presence of a chaperone)   LABORATORY DATA:  I have reviewed the data as listed Lab Results  Component Value Date   WBC 5.9 01/18/2015   HGB 14.0 01/18/2015   HCT 42.0 01/18/2015   MCV 88.4 01/18/2015   PLT 221 01/18/2015   Lab Results  Component Value Date   NA 140 01/18/2015   K 3.6 01/18/2015   CL 104 11/25/2008   CO2 28 01/18/2015   ASSESSMENT AND PLAN:  Breast cancer of upper-inner quadrant of right female breast ([place]) RIGHT breast biopsy 01/06/2015: 12:30: Invasive  lobular carcinoma grade 1, ER 95%, PR 0% insufficient for her to our Ki-67; LCIS with necrosis and calcifications; LEFT breast biopsy upper outer quadrant: DCIS with necrosis and calcifications, ER 100%, PR 80% Right breast mass at 12:30: 1 x 0.6 x 1.1 cm; left breast calcifications spanning 4 cm with a titer group measuring 5 mm, no pathologic lymph nodes Clinical stage: Tis N0 stage 0 in the left breast  Pathology review: I discussed with the patient the difference between DCIS and invasive breast cancer. It is considered a precancerous lesion. DCIS is classified as a 0. It is generally detected through mammograms as calcifications. We discussed the significance of grades and its impact on prognosis. We also discussed the importance of ER and PR receptors and their implications to adjuvant treatment options. Prognosis of DCIS dependence on grade, comedo necrosis. It is anticipated that if not treated,  20-30% of DCIS can develop into invasive breast cancer.  Recommendation: Breast MRI 1. Breast conserving surgeries bilaterally 2. Followed by adjuvant radiation therapy 3. Followed by antiestrogen therapy with anastrozole 5 years  Anastrozole counseling: We discussed the risks and benefits of tamoxifen. These include but not limited to insomnia, hot flashes, mood changes, vaginal dryness, and weight gain and risk of osteoporosis. We strongly believe that the benefits far outweigh the risks. Patient understands these risks and consented to starting treatment. Planned treatment duration is 5 years.  Return to clinic after surgery to discuss the final pathology report and come up with an adjuvant treatment plan.  All questions were answered. The patient knows to call the clinic with any problems, questions or concerns.    Rulon Eisenmenger, MD 01/18/2015

## 2015-01-18 NOTE — Progress Notes (Signed)
Note created by Dr. Gudena during office visit, copy to patient,original to scan. 

## 2015-01-18 NOTE — Progress Notes (Signed)
Clinical Social Work [place] Psychosocial Distress Screening [place]  Patient completed distress screening protocol and scored a 6 on the Psychosocial Distress Thermometer which indicates severe distress. Clinical Social Worker met with patient and patients family in [facility] to assess for distress and other psychosocial needs. Patient stated she was feeling overwhelmed but felt "better" after meeting with the treatment team and getting more information on her treatment plan. CSW and patient discussed common feeling and emotions when being diagnosed with cancer, and the importance of support during treatment. CSW informed patient of the support team and support services at [facility], and patient was agreeable to an alight guide. CSW provided contact information and encouraged patient to call with any questions or concerns.   ONCBCN DISTRESS SCREENING 01/18/2015  Screening Type Initial Screening  Distress experienced in past week (1-10) 6  Practical problem type Housing;Insurance;Work/school  Emotional problem type Adjusting to illness  Information Concerns Type Lack of info about maintaining fitness  Physician notified of physical symptoms Yes  Referral to clinical psychology No  Referral to clinical social work Yes  Referral to dietition No  Referral to financial advocate No  Referral to support programs Yes  Referral to palliative care No    Johnnye Lana, MSW, LCSW, OSW-C Clinical Social Worker [place] [phone]

## 2015-01-24 ENCOUNTER — Encounter: Payer: Self-pay | Admitting: *Deleted

## 2015-01-24 ENCOUNTER — Telehealth: Payer: Self-pay | Admitting: *Deleted

## 2015-01-24 NOTE — Telephone Encounter (Signed)
Left vm for pt to return call regarding [facility] from 01/20/15. Contact information provided. 

## 2015-01-24 NOTE — Progress Notes (Signed)
Pt relate doing well and denies questions or concerns regarding dx or treatment care plan. Encourage pt to call with needs. Received verbal understanding. Contact information provided.

## 2015-01-26 ENCOUNTER — Ambulatory Visit
Admission: RE | Admit: 2015-01-26 | Discharge: 2015-01-26 | Disposition: A | Discharge status: Home / Self Care | Payer: Medicare Other | Source: Ambulatory Visit | Attending: Surgery | Admitting: Surgery

## 2015-01-26 DIAGNOSIS — C50211 Malignant neoplasm of upper-inner quadrant of right female breast: Secondary | ICD-10-CM

## 2015-01-26 DIAGNOSIS — C50412 Malignant neoplasm of upper-outer quadrant of left female breast: Secondary | ICD-10-CM

## 2015-01-26 MED ORDER — GADOBENATE DIMEGLUMINE 529 MG/ML IV SOLN
18.0000 mL | Freq: Once | INTRAVENOUS | Status: AC | PRN
  Administered 2015-01-26: 18 mL via INTRAVENOUS

## 2015-01-29 HISTORY — PX: REDUCTION MAMMAPLASTY: SUR839

## 2015-01-29 HISTORY — PX: BREAST LUMPECTOMY: SHX2

## 2015-02-02 DIAGNOSIS — C50211 Malignant neoplasm of upper-inner quadrant of right female breast: Secondary | ICD-10-CM | POA: Diagnosis not present

## 2015-02-02 DIAGNOSIS — D0512 Intraductal carcinoma in situ of left breast: Secondary | ICD-10-CM | POA: Diagnosis not present

## 2015-02-07 ENCOUNTER — Other Ambulatory Visit: Payer: Self-pay | Admitting: Surgery

## 2015-02-07 DIAGNOSIS — C50912 Malignant neoplasm of unspecified site of left female breast: Principal | ICD-10-CM

## 2015-02-07 DIAGNOSIS — C50911 Malignant neoplasm of unspecified site of right female breast: Secondary | ICD-10-CM

## 2015-02-10 ENCOUNTER — Telehealth: Payer: Self-pay | Admitting: Hematology and Oncology

## 2015-02-10 ENCOUNTER — Other Ambulatory Visit: Payer: Self-pay | Admitting: *Deleted

## 2015-02-10 ENCOUNTER — Other Ambulatory Visit: Payer: Self-pay | Admitting: Surgery

## 2015-02-10 DIAGNOSIS — C50911 Malignant neoplasm of unspecified site of right female breast: Secondary | ICD-10-CM

## 2015-02-10 DIAGNOSIS — C50912 Malignant neoplasm of unspecified site of left female breast: Principal | ICD-10-CM

## 2015-02-10 NOTE — Telephone Encounter (Signed)
Patient aware of her follow up    °

## 2015-02-15 ENCOUNTER — Encounter (HOSPITAL_COMMUNITY): Payer: Self-pay

## 2015-02-15 ENCOUNTER — Encounter (HOSPITAL_COMMUNITY)
Admission: RE | Admit: 2015-02-15 | Discharge: 2015-02-15 | Disposition: A | Discharge status: Home / Self Care | Payer: Medicare Other | Source: Ambulatory Visit | Attending: Surgery | Admitting: Surgery

## 2015-02-15 DIAGNOSIS — Z01812 Encounter for preprocedural laboratory examination: Secondary | ICD-10-CM | POA: Insufficient documentation

## 2015-02-15 DIAGNOSIS — C50911 Malignant neoplasm of unspecified site of right female breast: Secondary | ICD-10-CM | POA: Diagnosis not present

## 2015-02-15 LAB — COMPREHENSIVE METABOLIC PANEL
ALT: 27 U/L (ref 14–54)
AST: 25 U/L (ref 15–41)
Albumin: 3.3 g/dL — ABNORMAL LOW (ref 3.5–5.0)
Alkaline Phosphatase: 63 U/L (ref 38–126)
Anion gap: 9 (ref 5–15)
BUN: 15 mg/dL (ref 6–20)
CO2: 29 mmol/L (ref 22–32)
Calcium: 10.3 mg/dL (ref 8.9–10.3)
Chloride: 102 mmol/L (ref 101–111)
Creatinine, Ser: 0.84 mg/dL (ref 0.44–1.00)
GFR calc Af Amer: >60 mL/min (ref >60)
GFR calc non Af Amer: >60 mL/min (ref >60)
Glucose, Bld: 94 mg/dL (ref 65–99)
Potassium: 3.9 mmol/L (ref 3.5–5.1)
Sodium: 140 mmol/L (ref 135–145)
Total Bilirubin: 0.5 mg/dL (ref 0.3–1.2)
Total Protein: 6.9 g/dL (ref 6.5–8.1)

## 2015-02-15 LAB — CBC WITH DIFFERENTIAL/PLATELET
Basophils Absolute: 0 10*3/uL (ref 0.0–0.1)
Basophils Relative: 1 %
Eosinophils Absolute: 0.2 10*3/uL (ref 0.0–0.7)
Eosinophils Relative: 3 %
HCT: 40 % (ref 36.0–46.0)
Hemoglobin: 13.2 g/dL (ref 12.0–15.0)
Lymphocytes Relative: 28 %
Lymphs Abs: 2.2 10*3/uL (ref 0.7–4.0)
MCH: 29.3 pg (ref 26.0–34.0)
MCHC: 33 g/dL (ref 30.0–36.0)
MCV: 88.7 fL (ref 78.0–100.0)
Monocytes Absolute: 0.2 10*3/uL (ref 0.1–1.0)
Monocytes Relative: 3 %
Neutro Abs: 5.1 10*3/uL (ref 1.7–7.7)
Neutrophils Relative %: 65 %
Platelets: 185 10*3/uL (ref 150–400)
RBC: 4.51 MIL/uL (ref 3.87–5.11)
RDW: 15.4 % (ref 11.5–15.5)
WBC: 7.7 10*3/uL (ref 4.0–10.5)

## 2015-02-15 NOTE — Pre-Procedure Instructions (Signed)
Brianna Jones  02/15/2015      [id number] [place] [place] [facility] [address] [place] [address] Phone: [phone] Fax: [phone]  [facility] [place] [place] [id number] Phone: [phone] Fax: [phone]    Your procedure is scheduled on   Thursday  02/23/15  Report to [facility] Admitting at 730 A.M.  Call this number if you have problems the morning of surgery:  [phone]   Remember:  Do not eat food or drink liquids after midnight.  Take these medicines the morning of surgery with A SIP OF WATER   AMLODIPINE (NORVASC), GABAPENTIN, HYDROCODONE IF NEEDED (STOP NAPROXEN SODIUM(ANAPROX), NO IBUPROFEN/ ADVIL/ MOTRIN, GOODY POWDERS/ BC'S, HERBAL MEDICINES)   Do not wear jewelry, make-up or nail polish.  Do not wear lotions, powders, or perfumes.  You may wear deodorant.  Do not shave 48 hours prior to surgery.  Men may shave face and neck.  Do not bring valuables to the hospital.  [facility] is not responsible for any belongings or valuables.  Contacts, dentures or bridgework may not be worn into surgery.  Leave your suitcase in the car.  After surgery it may be brought to your room.  For patients admitted to the hospital, discharge time will be determined by your treatment team.  Patients discharged the day of surgery will not be allowed to drive home.   Name and phone number of your driver:   Special instructions: [place] - Preparing for Surgery  Before surgery, you can play an important role.  Because skin is not sterile, your skin needs to be as free of germs as possible.  You can reduce the number of germs on you skin by washing with CHG (chlorahexidine gluconate) soap before surgery.  CHG is an antiseptic cleaner which kills germs and bonds with the skin to continue killing germs even after washing.  Please DO NOT use if you have an allergy to CHG or  antibacterial soaps.  If your skin becomes reddened/irritated stop using the CHG and inform your nurse when you arrive at Short Stay.  Do not shave (including legs and underarms) for at least 48 hours prior to the first CHG shower.  You may shave your face.  Please follow these instructions carefully:   1.  Shower with CHG Soap the night before surgery and the                                morning of Surgery.  2.  If you choose to wash your hair, wash your hair first as usual with your       normal shampoo.  3.  After you shampoo, rinse your hair and body thoroughly to remove the                      Shampoo.  4.  Use CHG as you would any other liquid soap.  You can apply chg directly       to the skin and wash gently with scrungie or a clean washcloth.  5.  Apply the CHG Soap to your body ONLY FROM THE NECK DOWN.        Do not use on open wounds or open sores.  Avoid contact with your eyes,       ears, mouth and genitals (  private parts).  Wash genitals (private parts)       with your normal soap.  6.  Wash thoroughly, paying special attention to the area where your surgery        will be performed.  7.  Thoroughly rinse your body with warm water from the neck down.  8.  DO NOT shower/wash with your normal soap after using and rinsing off       the CHG Soap.  9.  Pat yourself dry with a clean towel.            10.  Wear clean pajamas.            11.  Place clean sheets on your bed the night of your first shower and do not        sleep with pets.  Day of Surgery  Do not apply any lotions/deoderants the morning of surgery.  Please wear clean clothes to the hospital/surgery center.    Please read over the following fact sheets that you were given. Pain Booklet, Coughing and Deep Breathing and Surgical Site Infection Prevention

## 2015-02-21 ENCOUNTER — Ambulatory Visit
Admission: RE | Admit: 2015-02-21 | Discharge: 2015-02-21 | Disposition: A | Discharge status: Home / Self Care | Payer: Medicare Other | Source: Ambulatory Visit | Attending: Surgery | Admitting: Surgery

## 2015-02-21 DIAGNOSIS — C50911 Malignant neoplasm of unspecified site of right female breast: Secondary | ICD-10-CM | POA: Diagnosis not present

## 2015-02-21 DIAGNOSIS — C50912 Malignant neoplasm of unspecified site of left female breast: Principal | ICD-10-CM

## 2015-02-22 MED ORDER — HEPARIN SODIUM (PORCINE) 5000 UNIT/ML IJ SOLN
5000.0000 [IU] | Freq: Once | INTRAMUSCULAR | Status: AC
  Administered 2015-02-23: 5000 [IU] via SUBCUTANEOUS
  Filled 2015-02-22: qty 1

## 2015-02-22 MED ORDER — CHLORHEXIDINE GLUCONATE 4 % EX LIQD: 1.0000 "application " | Freq: Once | CUTANEOUS | Status: DC

## 2015-02-22 MED ORDER — CEFAZOLIN SODIUM-DEXTROSE 2-3 GM-% IV SOLR
2.0000 g | INTRAVENOUS | Status: AC
  Administered 2015-02-23: 2 g via INTRAVENOUS
  Filled 2015-02-22: qty 50

## 2015-02-22 NOTE — H&P (Signed)
Brianna Jones  Location: [facility] Patient #: [id number] DOB: 23-Apr-1950 Undefined / Language: Brianna Jones / Race: Black or African American Female  History of Present Illness   The patient is a 65 year old female who presents with breast cancer.  Her PCP is Dr. Johnnye Lana.  She is at the Breast [facility] - Oncology Drs. Gudena/Kinard She is accompanied by her daughter, [name].   Her last mammogram was 3 years ago. She has no family history of breast cancer. She is not on hormone therapy. She went for a routine mammography on 12/30/2014. She had a 1.1 cm mass in the UIQ of the right breast and a 4.0 cm of microca ++ in the UOQ of the left breast. She underwent bilateral breast biopsies on 01/06/2015 ([id number]) - right breast - invasive lobular ca with LCIS, ER - 95%, PR - 0%, not enough tissue for other things. left breast - DCIS, ER - 100%, PR - 80%   I discussed the options for breast cancer treatment with the patient and her daughter. She is at the Breast multidisciiplinary clinic, which includes medical oncology and radiation oncology. I discussed the surgical options of lumpectomy vs. mastectomy. If mastectomy, there is the possibility of reconstruction. I discussed the options of lymph node biopsy. The treatment plan depends on the pathologic staging of the tumor and the patient's personal wishes. The risks of surgery include, but are not limited to, bleeding, infection, the need for further surgery, and nerve injury. The patient has been given literature on the treatment of breast cancer.  Plan: 1) MRI of breast - if there are no new lesions, then, 2) bilateral lumpectomy (left breast lumpectomy too large and too near skin for mammosite) - she'll need seed on right and 2 wires on left, 3) Right axillar SLNBx, 4) depending on the size of the Ca in left breast on MRI - if DCIS > 5cm, consider left SLNBx 5) I did talk  to the patient about a staged lumpectomy with a bilateral breast reconstruction - I think that this overwhelmed her. We will see what the MRI shows and go from there. (note: the daughter knows someone who had a breast reduction, the patient does not)  I'll call her after the breast MRI  Past Medical History: 1. L5 neuropathy on gabapentin 2. HTN > 20 years 3. Colonoscopy in 2008  Social History Divorced. She is accompanied by her daughter, [name]. Has son in [place].  Other Problems Conni Slipper, RN; 01/18/2015 8:14 AM) Arthritis Back Pain Breast Cancer High blood pressure Hypercholesterolemia Lump In Breast  Past Surgical History Conni Slipper, RN; 01/18/2015 8:14 AM) Breast Biopsy Bilateral. Colon Polyp Removal - Colonoscopy Foot Surgery Right. Oral Surgery Shoulder Surgery Left.  Diagnostic Studies History Conni Slipper, RN; 01/18/2015 8:14 AM) Colonoscopy 5-10 years ago  Medication History Conni Slipper, RN; 01/18/2015 8:14 AM) No Current Medications Medications Reconciled  Social History Conni Slipper, RN; 01/18/2015 8:14 AM) Alcohol use Moderate alcohol use. No drug use Tobacco use Never smoker.  Family History Conni Slipper, RN; 01/18/2015 8:14 AM) Arthritis Family Members In General, Mother, Sister. Cancer Family Members In General. Cerebrovascular Accident Brother, Sister. Diabetes Mellitus Brother, Father, Sister. Heart Disease Mother. Hypertension Mother, Sister.  Pregnancy / Birth History Conni Slipper, RN; 01/18/2015 8:14 AM) Age at menarche 40 years. Age of menopause 51-55 Contraceptive History Oral contraceptives. Gravida 3 Irregular periods Maternal age 37-25 Para 2  Review of Systems Conni Slipper RN; 01/18/2015 8:14 AM)  General Not Present- Appetite Loss, Chills, Fatigue, Fever, Night Sweats, Weight Gain and Weight Loss. Skin Present- Dryness. Not Present- Change in Wart/Mole, Hives,  Jaundice, New Lesions, Non-Healing Wounds, Rash and Ulcer. HEENT Present- Sinus Pain and Wears glasses/contact lenses. Not Present- Earache, Hearing Loss, Hoarseness, Nose Bleed, Oral Ulcers, Ringing in the Ears, Seasonal Allergies, Sore Throat, Visual Disturbances and Yellow Eyes. Respiratory Not Present- Bloody sputum, Chronic Cough, Difficulty Breathing, Snoring and Wheezing. Breast Present- Breast Pain. Not Present- Breast Mass, Nipple Discharge and Skin Changes. Cardiovascular Not Present- Chest Pain, Difficulty Breathing Lying Down, Leg Cramps, Palpitations, Rapid Heart Rate, Shortness of Breath and Swelling of Extremities. Gastrointestinal Not Present- Abdominal Pain, Bloating, Bloody Stool, Change in Bowel Habits, Chronic diarrhea, Constipation, Difficulty Swallowing, Excessive gas, Gets full quickly at meals, Hemorrhoids, Indigestion, Nausea, Rectal Pain and Vomiting. Female Genitourinary Not Present- Frequency, Nocturia, Painful Urination, Pelvic Pain and Urgency. Musculoskeletal Present- Back Pain and Joint Stiffness. Not Present- Joint Pain, Muscle Pain, Muscle Weakness and Swelling of Extremities. Neurological Not Present- Decreased Memory, Fainting, Headaches, Numbness, Seizures, Tingling, Tremor, Trouble walking and Weakness. Endocrine Not Present- Cold Intolerance, Excessive Hunger, Hair Changes, Heat Intolerance, Hot flashes and New Diabetes.   Assessment & Plan  1  BREAST CANCER, STAGE 1, RIGHT (C50.911)  Story: Bilateral breast biopsies on 01/06/2015 ([id number]) - right breast - invasive lobular ca with LCIS, ER - 95%, PR - 0%, not enough tissue for other things. left breast - DCIS, ER - 100%, PR - 80%   Oncology - Gudena/Kinard  Impression: Plan:    1) MRI of breast - if there are no new lesions, then,    2) bilateral lumpectomy (left breast lumpectomy too large and too near skin for mammosite) - she'll need seed on right and 2 wires on left,    3) Right axillar  SLNBx,    4) depending on the size of the Ca in left breast on MRI - if DCIS > 5cm, consider left SLNBx    5) I did talk to the patient about a staged lumpectomy with a bilateral breast reconstruction - I think that this overwhelmed her. We will see what the MRI shows and go from there. (note: the daughter knows someone who had a breast reduction, the patient does not)  2.  BREAST CANCER, STAGE 0, LEFT (D05.92)  Story: Bilateral breast biopsies on 01/06/2015 ([id number]) - right breast - invasive lobular ca with LCIS, ER - 95%, PR - 0%, not enough tissue for other things. left breast - DCIS, ER - 100%, PR - 80%     Addendum Note(Jacque Byron H. [name] MD; 01/26/2015 3:37 PM) MRI of breast - 01/26/2015 -  1. Right breast malignancy at the 12:30 location anterior depth measures approximately 1.1 cm. No additional findings in the right breast to suggest multifocal or multicentric disease.   2. Left breast malignancy measures nearly 5 cm with clumped linear enhancement extending posterior superiorly from the biopsy marking clip in the upper-outer left breast. (she will need bracketed wires on the left)   Read by Dr. Everlean Alstrom - I spoke to her on the phone.   I discussed findings with the patient - she sees Dr. Iran Planas on 02/02/2015.   She is interested in bilateral lumpectomy with delayed bilateral breast reduction. She'll talk to me after seeing Dr. Iran Planas.    Addendum Note(Jahn Franchini H. [name] MD; 02/07/2015 3:25 PM)  She has seen Dr. Iran Planas.  Plan - she will need bracketed wires on the left and  a seed on the right for bilateral breast lumpectomies. On the right, she will need a lymph node biopsy.  My surgery is scheduled for 02/23/2015. Dr. Para Skeans reduction is 03/03/2015.  I spoke to the patient by phone.  3. L5 neuropathy on gabapentin 4. HTN > 20 years  Alphonsa Overall, MD, [facility] Surgery Pager: [phone] Office phone:   [phone]

## 2015-02-23 ENCOUNTER — Encounter (HOSPITAL_COMMUNITY): Payer: Self-pay | Admitting: Surgery

## 2015-02-23 ENCOUNTER — Ambulatory Visit
Admission: RE | Admit: 2015-02-23 | Discharge: 2015-02-23 | Disposition: A | Discharge status: Home / Self Care | Payer: Medicare Other | Source: Ambulatory Visit | Attending: Surgery | Admitting: Surgery

## 2015-02-23 ENCOUNTER — Ambulatory Visit (HOSPITAL_COMMUNITY)
Admission: RE | Admit: 2015-02-23 | Discharge: 2015-02-23 | Disposition: A | Discharge status: Home / Self Care | Payer: Medicare Other | Source: Ambulatory Visit | Attending: Surgery | Admitting: Surgery

## 2015-02-23 ENCOUNTER — Observation Stay (HOSPITAL_COMMUNITY)
Admission: RE | Admit: 2015-02-23 | Discharge: 2015-02-24 | Disposition: A | Discharge status: Home / Self Care | Payer: Medicare Other | Source: Ambulatory Visit | Attending: Surgery | Admitting: Surgery

## 2015-02-23 ENCOUNTER — Ambulatory Visit
Admit: 2015-02-23 | Discharge: 2015-02-23 | Disposition: A | Discharge status: Home / Self Care | Payer: Medicare Other | Attending: Surgery | Admitting: Surgery

## 2015-02-23 ENCOUNTER — Encounter (HOSPITAL_COMMUNITY)
Admission: RE | Disposition: A | Discharge status: Home / Self Care | Payer: Self-pay | Source: Ambulatory Visit | Attending: Surgery

## 2015-02-23 ENCOUNTER — Ambulatory Visit (HOSPITAL_COMMUNITY): Payer: Medicare Other | Admitting: Anesthesiology

## 2015-02-23 DIAGNOSIS — M549 Dorsalgia, unspecified: Secondary | ICD-10-CM | POA: Diagnosis not present

## 2015-02-23 DIAGNOSIS — G629 Polyneuropathy, unspecified: Secondary | ICD-10-CM | POA: Diagnosis not present

## 2015-02-23 DIAGNOSIS — C50912 Malignant neoplasm of unspecified site of left female breast: Principal | ICD-10-CM

## 2015-02-23 DIAGNOSIS — I10 Essential (primary) hypertension: Secondary | ICD-10-CM | POA: Insufficient documentation

## 2015-02-23 DIAGNOSIS — D0501 Lobular carcinoma in situ of right breast: Secondary | ICD-10-CM | POA: Diagnosis not present

## 2015-02-23 DIAGNOSIS — C50911 Malignant neoplasm of unspecified site of right female breast: Secondary | ICD-10-CM

## 2015-02-23 DIAGNOSIS — C50211 Malignant neoplasm of upper-inner quadrant of right female breast: Secondary | ICD-10-CM | POA: Diagnosis not present

## 2015-02-23 DIAGNOSIS — G8918 Other acute postprocedural pain: Secondary | ICD-10-CM | POA: Diagnosis not present

## 2015-02-23 DIAGNOSIS — M1990 Unspecified osteoarthritis, unspecified site: Secondary | ICD-10-CM | POA: Diagnosis not present

## 2015-02-23 DIAGNOSIS — Z171 Estrogen receptor negative status [ER-]: Secondary | ICD-10-CM | POA: Diagnosis not present

## 2015-02-23 DIAGNOSIS — E7800 Pure hypercholesterolemia, unspecified: Secondary | ICD-10-CM | POA: Diagnosis not present

## 2015-02-23 DIAGNOSIS — D0512 Intraductal carcinoma in situ of left breast: Secondary | ICD-10-CM | POA: Diagnosis not present

## 2015-02-23 DIAGNOSIS — C50919 Malignant neoplasm of unspecified site of unspecified female breast: Secondary | ICD-10-CM | POA: Diagnosis not present

## 2015-02-23 DIAGNOSIS — C50412 Malignant neoplasm of upper-outer quadrant of left female breast: Secondary | ICD-10-CM | POA: Diagnosis not present

## 2015-02-23 HISTORY — DX: Polyneuropathy, unspecified: G62.9

## 2015-02-23 HISTORY — PX: BREAST LUMPECTOMY WITH NEEDLE LOCALIZATION: SHX5759

## 2015-02-23 HISTORY — PX: BREAST LUMPECTOMY WITH RADIOACTIVE SEED AND SENTINEL LYMPH NODE BIOPSY: SHX6550

## 2015-02-23 SURGERY — BREAST LUMPECTOMY WITH RADIOACTIVE SEED AND SENTINEL LYMPH NODE BIOPSY
Anesthesia: General | Site: Breast | Laterality: Right

## 2015-02-23 MED ORDER — PROPOFOL 10 MG/ML IV BOLUS
INTRAVENOUS | Status: AC
  Filled 2015-02-23: qty 20

## 2015-02-23 MED ORDER — ONDANSETRON HCL 4 MG/2ML IJ SOLN
INTRAMUSCULAR | Status: AC
  Filled 2015-02-23: qty 2

## 2015-02-23 MED ORDER — LACTATED RINGERS IV SOLN
INTRAVENOUS | Status: DC
  Administered 2015-02-23 (×3): via INTRAVENOUS

## 2015-02-23 MED ORDER — FENTANYL CITRATE (PF) 250 MCG/5ML IJ SOLN
INTRAMUSCULAR | Status: DC | PRN
  Administered 2015-02-23: 50 ug via INTRAVENOUS
  Administered 2015-02-23: 25 ug via INTRAVENOUS
  Administered 2015-02-23: 50 ug via INTRAVENOUS
  Administered 2015-02-23: 25 ug via INTRAVENOUS
  Administered 2015-02-23: 50 ug via INTRAVENOUS
  Administered 2015-02-23: 25 ug via INTRAVENOUS

## 2015-02-23 MED ORDER — GABAPENTIN 300 MG PO CAPS
300.0000 mg | ORAL_CAPSULE | Freq: Every day | ORAL | Status: DC
  Filled 2015-02-23: qty 1

## 2015-02-23 MED ORDER — HYDROCHLOROTHIAZIDE 25 MG PO TABS: 25.0000 mg | ORAL_TABLET | Freq: Every day | ORAL | Status: DC

## 2015-02-23 MED ORDER — HEPARIN SODIUM (PORCINE) 5000 UNIT/ML IJ SOLN
5000.0000 [IU] | Freq: Three times a day (TID) | INTRAMUSCULAR | Status: DC
  Administered 2015-02-23 – 2015-02-24 (×2): 5000 [IU] via SUBCUTANEOUS
  Filled 2015-02-23 (×2): qty 1

## 2015-02-23 MED ORDER — PROPOFOL 10 MG/ML IV BOLUS
INTRAVENOUS | Status: DC | PRN
  Administered 2015-02-23: 25 mg via INTRAVENOUS
  Administered 2015-02-23: 150 mg via INTRAVENOUS
  Administered 2015-02-23: 25 mg via INTRAVENOUS

## 2015-02-23 MED ORDER — GABAPENTIN 300 MG PO CAPS
600.0000 mg | ORAL_CAPSULE | Freq: Every day | ORAL | Status: DC
  Administered 2015-02-23: 600 mg via ORAL
  Filled 2015-02-23: qty 2

## 2015-02-23 MED ORDER — MIDAZOLAM HCL 2 MG/2ML IJ SOLN
INTRAMUSCULAR | Status: DC | PRN
  Administered 2015-02-23 (×2): 1 mg via INTRAVENOUS

## 2015-02-23 MED ORDER — METHYLENE BLUE 1 % INJ SOLN
INTRAMUSCULAR | Status: AC
  Filled 2015-02-23: qty 10

## 2015-02-23 MED ORDER — AMLODIPINE BESYLATE 10 MG PO TABS: 10.0000 mg | ORAL_TABLET | Freq: Every day | ORAL | Status: DC

## 2015-02-23 MED ORDER — ONDANSETRON 4 MG PO TBDP: 4.0000 mg | ORAL_TABLET | Freq: Four times a day (QID) | ORAL | Status: DC | PRN

## 2015-02-23 MED ORDER — FENTANYL CITRATE (PF) 250 MCG/5ML IJ SOLN
INTRAMUSCULAR | Status: AC
  Filled 2015-02-23: qty 5

## 2015-02-23 MED ORDER — SODIUM CHLORIDE 0.9 % IJ SOLN
INTRAMUSCULAR | Status: AC
  Filled 2015-02-23: qty 10

## 2015-02-23 MED ORDER — HYDROCODONE-ACETAMINOPHEN 5-325 MG PO TABS
1.0000 | ORAL_TABLET | ORAL | Status: DC | PRN
  Administered 2015-02-23: 1 via ORAL
  Filled 2015-02-23: qty 2

## 2015-02-23 MED ORDER — LACTATED RINGERS IV SOLN: INTRAVENOUS | Status: DC

## 2015-02-23 MED ORDER — VALSARTAN-HYDROCHLOROTHIAZIDE 160-25 MG PO TABS: 1.0000 | ORAL_TABLET | Freq: Every day | ORAL | Status: DC

## 2015-02-23 MED ORDER — BUPIVACAINE-EPINEPHRINE (PF) 0.25% -1:200000 IJ SOLN
INTRAMUSCULAR | Status: AC
  Filled 2015-02-23: qty 30

## 2015-02-23 MED ORDER — LIDOCAINE HCL (CARDIAC) 20 MG/ML IV SOLN
INTRAVENOUS | Status: AC
  Filled 2015-02-23: qty 5

## 2015-02-23 MED ORDER — ARTIFICIAL TEARS OP OINT
TOPICAL_OINTMENT | OPHTHALMIC | Status: DC | PRN
  Administered 2015-02-23: 1 via OPHTHALMIC

## 2015-02-23 MED ORDER — GABAPENTIN 300 MG PO CAPS: 300.0000 mg | ORAL_CAPSULE | Freq: Two times a day (BID) | ORAL | Status: DC

## 2015-02-23 MED ORDER — SODIUM CHLORIDE 0.9 % IJ SOLN
INTRAMUSCULAR | Status: DC | PRN
  Administered 2015-02-23: 2 mL via INTRAMUSCULAR

## 2015-02-23 MED ORDER — POTASSIUM CHLORIDE IN NACL 20-0.45 MEQ/L-% IV SOLN
INTRAVENOUS | Status: DC
  Administered 2015-02-23: 16:00:00 via INTRAVENOUS
  Filled 2015-02-23 (×2): qty 1000

## 2015-02-23 MED ORDER — BUPIVACAINE HCL (PF) 0.5 % IJ SOLN
INTRAMUSCULAR | Status: DC | PRN
  Administered 2015-02-23: 30 mL via PERINEURAL

## 2015-02-23 MED ORDER — ARTIFICIAL TEARS OP OINT
TOPICAL_OINTMENT | OPHTHALMIC | Status: AC
  Filled 2015-02-23: qty 3.5

## 2015-02-23 MED ORDER — GLYCOPYRROLATE 0.2 MG/ML IJ SOLN
INTRAMUSCULAR | Status: AC
  Filled 2015-02-23: qty 1

## 2015-02-23 MED ORDER — GLYCOPYRROLATE 0.2 MG/ML IJ SOLN
INTRAMUSCULAR | Status: DC | PRN
  Administered 2015-02-23: 0.1 mg via INTRAVENOUS

## 2015-02-23 MED ORDER — FENTANYL CITRATE (PF) 100 MCG/2ML IJ SOLN
INTRAMUSCULAR | Status: AC
  Administered 2015-02-23: 50 ug
  Filled 2015-02-23: qty 2

## 2015-02-23 MED ORDER — ONDANSETRON HCL 4 MG/2ML IJ SOLN: 4.0000 mg | Freq: Four times a day (QID) | INTRAMUSCULAR | Status: DC | PRN

## 2015-02-23 MED ORDER — PHENYLEPHRINE HCL 10 MG/ML IJ SOLN
10.0000 mg | INTRAVENOUS | Status: DC | PRN
  Administered 2015-02-23: 50 ug/min via INTRAVENOUS

## 2015-02-23 MED ORDER — DEXAMETHASONE SODIUM PHOSPHATE 4 MG/ML IJ SOLN
INTRAMUSCULAR | Status: AC
  Filled 2015-02-23: qty 1

## 2015-02-23 MED ORDER — 0.9 % SODIUM CHLORIDE (POUR BTL) OPTIME
TOPICAL | Status: DC | PRN
  Administered 2015-02-23 (×2): 1000 mL

## 2015-02-23 MED ORDER — ONDANSETRON HCL 4 MG/2ML IJ SOLN
INTRAMUSCULAR | Status: DC | PRN
Start: 2015-02-23 — End: 2015-02-23
  Administered 2015-02-23: 4 mg via INTRAVENOUS

## 2015-02-23 MED ORDER — LIDOCAINE HCL (CARDIAC) 20 MG/ML IV SOLN
INTRAVENOUS | Status: DC | PRN
  Administered 2015-02-23: 60 mg via INTRATRACHEAL

## 2015-02-23 MED ORDER — MORPHINE SULFATE (PF) 2 MG/ML IV SOLN
1.0000 mg | INTRAVENOUS | Status: DC | PRN
  Administered 2015-02-23: 2 mg via INTRAVENOUS
  Filled 2015-02-23: qty 1

## 2015-02-23 MED ORDER — IBUPROFEN 600 MG PO TABS: 600.0000 mg | ORAL_TABLET | Freq: Four times a day (QID) | ORAL | Status: DC | PRN

## 2015-02-23 MED ORDER — METOPROLOL TARTRATE 1 MG/ML IV SOLN
INTRAVENOUS | Status: DC | PRN
  Administered 2015-02-23 (×2): 1 mg via INTRAVENOUS

## 2015-02-23 MED ORDER — TECHNETIUM TC 99M SULFUR COLLOID FILTERED
1.0000 | Freq: Once | INTRAVENOUS | Status: AC | PRN
  Administered 2015-02-23: 1 via INTRADERMAL

## 2015-02-23 MED ORDER — METOPROLOL TARTRATE 1 MG/ML IV SOLN
INTRAVENOUS | Status: AC
  Filled 2015-02-23: qty 5

## 2015-02-23 MED ORDER — IRBESARTAN 150 MG PO TABS: 150.0000 mg | ORAL_TABLET | Freq: Every day | ORAL | Status: DC

## 2015-02-23 MED ORDER — FENTANYL CITRATE (PF) 100 MCG/2ML IJ SOLN: 25.0000 ug | INTRAMUSCULAR | Status: DC | PRN

## 2015-02-23 MED ORDER — BUPIVACAINE HCL (PF) 0.25 % IJ SOLN
INTRAMUSCULAR | Status: AC
  Filled 2015-02-23: qty 30

## 2015-02-23 MED ORDER — MIDAZOLAM HCL 2 MG/2ML IJ SOLN
INTRAMUSCULAR | Status: AC
  Filled 2015-02-23: qty 2

## 2015-02-23 MED ORDER — MIDAZOLAM HCL 2 MG/2ML IJ SOLN
INTRAMUSCULAR | Status: AC
  Administered 2015-02-23: 2 mg
  Filled 2015-02-23: qty 2

## 2015-02-23 MED ORDER — PHENYLEPHRINE HCL 10 MG/ML IJ SOLN
INTRAMUSCULAR | Status: DC | PRN
  Administered 2015-02-23: 120 ug via INTRAVENOUS
  Administered 2015-02-23: 80 ug via INTRAVENOUS
  Administered 2015-02-23: 120 ug via INTRAVENOUS
  Administered 2015-02-23: 80 ug via INTRAVENOUS

## 2015-02-23 MED ORDER — DEXAMETHASONE SODIUM PHOSPHATE 4 MG/ML IJ SOLN
INTRAMUSCULAR | Status: DC | PRN
  Administered 2015-02-23: 4 mg via INTRAVENOUS

## 2015-02-23 MED ORDER — CYCLOBENZAPRINE HCL 10 MG PO TABS
10.0000 mg | ORAL_TABLET | Freq: Three times a day (TID) | ORAL | Status: DC | PRN
Start: 2015-02-23 — End: 2015-02-24

## 2015-02-23 MED ORDER — BUPIVACAINE-EPINEPHRINE 0.25% -1:200000 IJ SOLN
INTRAMUSCULAR | Status: DC | PRN
  Administered 2015-02-23: 20 mL

## 2015-02-23 SURGICAL SUPPLY — 62 items
APPLIER CLIP 9.375 MED OPEN (MISCELLANEOUS) ×3
BENZOIN TINCTURE PRP APPL 2/3 (GAUZE/BANDAGES/DRESSINGS) IMPLANT
BINDER BREAST LRG (GAUZE/BANDAGES/DRESSINGS) IMPLANT
BINDER BREAST XLRG (GAUZE/BANDAGES/DRESSINGS) IMPLANT
BINDER BREAST XXLRG (GAUZE/BANDAGES/DRESSINGS) ×3 IMPLANT
BLADE SURG 15 STRL LF DISP TIS (BLADE) ×2 IMPLANT
BLADE SURG 15 STRL SS (BLADE) ×1
CANISTER SUCTION 2500CC (MISCELLANEOUS) ×3 IMPLANT
CHLORAPREP W/TINT 26ML (MISCELLANEOUS) ×6 IMPLANT
CLIP APPLIE 9.375 MED OPEN (MISCELLANEOUS) ×2 IMPLANT
CLIP TI WIDE RED SMALL 6 (CLIP) ×3 IMPLANT
CONT SPEC 4OZ CLIKSEAL STRL BL (MISCELLANEOUS) ×3 IMPLANT
COVER PROBE W GEL 5X96 (DRAPES) ×3 IMPLANT
COVER SURGICAL LIGHT HANDLE (MISCELLANEOUS) ×3 IMPLANT
DECANTER SPIKE VIAL GLASS SM (MISCELLANEOUS) IMPLANT
DEVICE DUBIN SPECIMEN MAMMOGRA (MISCELLANEOUS) ×6 IMPLANT
DRAPE CHEST BREAST 15X10 FENES (DRAPES) ×3 IMPLANT
DRAPE UTILITY XL STRL (DRAPES) ×6 IMPLANT
ELECT CAUTERY BLADE 6.4 (BLADE) ×3 IMPLANT
ELECT COATED BLADE 2.86 ST (ELECTRODE) ×3 IMPLANT
ELECT REM PT RETURN 9FT ADLT (ELECTROSURGICAL) ×3
ELECTRODE REM PT RTRN 9FT ADLT (ELECTROSURGICAL) ×2 IMPLANT
GAUZE SPONGE 4X4 12PLY STRL (GAUZE/BANDAGES/DRESSINGS) ×3 IMPLANT
GLOVE BIO SURGEON STRL SZ 6.5 (GLOVE) ×3 IMPLANT
GLOVE BIOGEL PI IND STRL 6.5 (GLOVE) ×2 IMPLANT
GLOVE BIOGEL PI IND STRL 7.0 (GLOVE) ×2 IMPLANT
GLOVE BIOGEL PI INDICATOR 6.5 (GLOVE) ×1
GLOVE BIOGEL PI INDICATOR 7.0 (GLOVE) ×1
GLOVE SURG SIGNA 7.5 PF LTX (GLOVE) ×9 IMPLANT
GLOVE SURG SS PI 6.5 STRL IVOR (GLOVE) ×6 IMPLANT
GOWN STRL REUS W/ TWL LRG LVL3 (GOWN DISPOSABLE) ×8 IMPLANT
GOWN STRL REUS W/ TWL XL LVL3 (GOWN DISPOSABLE) ×2 IMPLANT
GOWN STRL REUS W/TWL LRG LVL3 (GOWN DISPOSABLE) ×4
GOWN STRL REUS W/TWL XL LVL3 (GOWN DISPOSABLE) ×1
KIT BASIN OR (CUSTOM PROCEDURE TRAY) ×3 IMPLANT
KIT MARKER MARGIN INK (KITS) ×6 IMPLANT
KIT ROOM TURNOVER OR (KITS) ×3 IMPLANT
LIQUID BAND (GAUZE/BANDAGES/DRESSINGS) ×3 IMPLANT
NDL SAFETY ECLIPSE 18X1.5 (NEEDLE) IMPLANT
NEEDLE 18GX1X1/2 (RX/OR ONLY) (NEEDLE) ×3 IMPLANT
NEEDLE HYPO 18GX1.5 SHARP (NEEDLE)
NEEDLE HYPO 25GX1X1/2 BEV (NEEDLE) ×3 IMPLANT
NEEDLE HYPO 25X1 1.5 SAFETY (NEEDLE) ×3 IMPLANT
NS IRRIG 1000ML POUR BTL (IV SOLUTION) ×6 IMPLANT
PACK SURGICAL SETUP 50X90 (CUSTOM PROCEDURE TRAY) ×3 IMPLANT
PAD ARMBOARD 7.5X6 YLW CONV (MISCELLANEOUS) ×3 IMPLANT
PENCIL BUTTON HOLSTER BLD 10FT (ELECTRODE) ×3 IMPLANT
SPONGE LAP 18X18 X RAY DECT (DISPOSABLE) ×6 IMPLANT
SPONGE LAP 4X18 X RAY DECT (DISPOSABLE) ×3 IMPLANT
STAPLER VISISTAT 35W (STAPLE) ×3 IMPLANT
STRIP CLOSURE SKIN 1/4X4 (GAUZE/BANDAGES/DRESSINGS) IMPLANT
SUT MNCRL AB 4-0 PS2 18 (SUTURE) ×9 IMPLANT
SUT MON AB 5-0 PS2 18 (SUTURE) ×3 IMPLANT
SUT VIC AB 3-0 SH 18 (SUTURE) ×3 IMPLANT
SUT VIC AB 3-0 SH 8-18 (SUTURE) ×9 IMPLANT
SYR BULB 3OZ (MISCELLANEOUS) ×3 IMPLANT
SYR CONTROL 10ML LL (SYRINGE) ×6 IMPLANT
TAPE CLOTH SURG 4X10 WHT LF (GAUZE/BANDAGES/DRESSINGS) ×3 IMPLANT
TOWEL OR 17X24 6PK STRL BLUE (TOWEL DISPOSABLE) ×3 IMPLANT
TOWEL OR 17X26 10 PK STRL BLUE (TOWEL DISPOSABLE) ×3 IMPLANT
TUBE CONNECTING 12X1/4 (SUCTIONS) ×3 IMPLANT
YANKAUER SUCT BULB TIP NO VENT (SUCTIONS) ×3 IMPLANT

## 2015-02-23 NOTE — Progress Notes (Signed)
Pt admitted to 6N18 via stretcher from PACU.  Pt AAO X 4.  Pt on RA.  Pt has 18G to lt hand with fluids infusing.  Pt has bilateral incisions to breast with surgical dsg in place, clean, dry, intact.  Pt wearing breast binder.  Pt due to void.  Report rcvd from [place], [place].  Pt has no complaints at the moment.  Will continue to monitor.

## 2015-02-23 NOTE — Anesthesia Postprocedure Evaluation (Addendum)
Anesthesia Post Note  Patient: Brianna Jones  Procedure(s) Performed: Procedure(s) (LRB): RIGHT BREAST LUMPECTOMY WITH RADIOACTIVE SEED AND RIGHT AXILLARY SENTINEL LYMPH NODE BIOPSY (Right) LEFT BREAST LUMPECTOMY WITH TWO NEEDLE LOCALIZATION BRACKET (Left)  Patient location during evaluation: PACU Anesthesia Type: General Level of consciousness: awake and alert Pain management: pain level controlled Vital Signs Assessment: post-procedure vital signs reviewed and stable Respiratory status: spontaneous breathing, nonlabored ventilation, respiratory function stable and patient connected to nasal cannula oxygen Cardiovascular status: blood pressure returned to baseline and stable Postop Assessment: no signs of nausea or vomiting Anesthetic complications: no    Last Vitals:  Filed Vitals:   02/23/15 1315 02/23/15 1330  BP: 108/76 108/73  Pulse: 78 80  Temp:    Resp: 17 16    Last Pain:  Filed Vitals:   02/23/15 1335  PainSc: 0-No pain                 Chevonne Bostrom L

## 2015-02-23 NOTE — Transfer of Care (Signed)
Immediate Anesthesia Transfer of Care Note  Patient: Brianna Jones  Procedure(s) Performed: Procedure(s): RIGHT BREAST LUMPECTOMY WITH RADIOACTIVE SEED AND RIGHT AXILLARY SENTINEL LYMPH NODE BIOPSY (Right) LEFT BREAST LUMPECTOMY WITH TWO NEEDLE LOCALIZATION BRACKET (Left)  Patient Location: PACU  Anesthesia Type:General  Level of Consciousness: awake, oriented and patient cooperative  Airway & Oxygen Therapy: Patient Spontanous Breathing and Patient connected to face mask oxygen  Post-op Assessment: Report given to RN, Post -op Vital signs reviewed and stable, Patient moving all extremities X 4 and Patient able to stick tongue midline  Post vital signs: Reviewed and stable  Last Vitals:  Filed Vitals:   02/23/15 0955 02/23/15 1000  BP: 126/62 126/66  Pulse: 114 117  Resp: 23     Complications: No apparent anesthesia complications

## 2015-02-23 NOTE — Discharge Instructions (Signed)
[facility] - DISCHARGE INSTRUCTIONS TO PATIENT  Activity:  Driving - May drive in 2 or 3 days, if doing well   Lifting - No lifting more than 15 pounds for 1 week, then no limit  Wound Care:   May shower on Saturday, 02/25/2015  Diet:  As tolerated  Follow up appointment:  Call Dr. Pollie Friar office [facility]) at [phone] for an appointment in 2 to 3 weeks.  Medications and dosages:  Resume your home medications.  You have a prescription for:  Vicodin  Call Dr. [name] or his office  [phone]) if you have:  Temperature greater than 100.4,  Persistent nausea and vomiting,  Severe uncontrolled pain,  Redness, tenderness, or signs of infection (pain, swelling, redness, odor or green/yellow discharge around the site),  Difficulty breathing, headache or visual disturbances,  Any other questions or concerns you may have after discharge.  In an emergency, call 911 or go to an Emergency Department at a nearby hospital.

## 2015-02-23 NOTE — Anesthesia Procedure Notes (Addendum)
Anesthesia Regional Block:  Pectoralis block  Pre-Anesthetic Checklist: ,, timeout performed, Correct Patient, Correct Site, Correct Laterality, Correct Procedure, Correct Position, site marked, Risks and benefits discussed,  Surgical consent,  Pre-op evaluation,  At surgeon's request and post-op pain management  Laterality: Right  Prep: chloraprep       Needles:  Injection technique: Single-shot  Needle Type: Echogenic Needle     Needle Length: 9cm 9 cm Needle Gauge: 21 and 21 G    Additional Needles:  Procedures: ultrasound guided (picture in chart) Pectoralis block Narrative:  Start time: 02/23/2015 9:40 AM End time: 02/23/2015 9:50 AM Injection made incrementally with aspirations every 5 mL.  Performed by: Personally  Anesthesiologist: Rod Mae  Additional Notes: No complications   Procedure Name: LMA Insertion Date/Time: 02/23/2015 10:22 AM Performed by: Collier Bullock Pre-anesthesia Checklist: Patient identified, Emergency Drugs available, Suction available and Patient being monitored Patient Re-evaluated:Patient Re-evaluated prior to inductionOxygen Delivery Method: Circle system utilized Preoxygenation: Pre-oxygenation with 100% oxygen Intubation Type: IV induction Ventilation: Mask ventilation without difficulty LMA: LMA inserted LMA Size: 4.0 Number of attempts: 1 Tube secured with: Tape Dental Injury: Teeth and Oropharynx as per pre-operative assessment

## 2015-02-23 NOTE — Anesthesia Preprocedure Evaluation (Addendum)
Anesthesia Evaluation  Patient identified by MRN, date of birth, ID band Patient awake    Reviewed: Allergy & Precautions, H&P , NPO status , Patient's Chart, lab work & pertinent test results  Airway Mallampati: II  TM Distance: >3 FB Neck ROM: full    Dental  (+) Dental Advisory Given, Missing Missing all front upper teeth:   Pulmonary neg pulmonary ROS,    Pulmonary exam normal breath sounds clear to auscultation       Cardiovascular Exercise Tolerance: Good hypertension, Pt. on medications Normal cardiovascular exam Rhythm:regular Rate:Normal     Neuro/Psych negative neurological ROS  negative psych ROS   GI/Hepatic negative GI ROS, Neg liver ROS,   Endo/Other  negative endocrine ROS  Renal/GU negative Renal ROS  negative genitourinary   Musculoskeletal   Abdominal   Peds  Hematology negative hematology ROS (+)   Anesthesia Other Findings   Reproductive/Obstetrics negative OB ROS                            Anesthesia Physical Anesthesia Plan  ASA: II  Anesthesia Plan: General   Post-op Pain Management:    Induction: Intravenous  Airway Management Planned: LMA  Additional Equipment:   Intra-op Plan:   Post-operative Plan:   Informed Consent: I have reviewed the patients History and Physical, chart, labs and discussed the procedure including the risks, benefits and alternatives for the proposed anesthesia with the patient or authorized representative who has indicated his/her understanding and acceptance.   Dental Advisory Given  Plan Discussed with: CRNA and Surgeon  Anesthesia Plan Comments:         Anesthesia Quick Evaluation

## 2015-02-23 NOTE — Op Note (Signed)
02/23/2015  12:48 PM  PATIENT:  Brianna Jones DOB: February 23, 1950 MRN: [id number]  PREOP DIAGNOSIS:  Bilateral breast cancers  POSTOP DIAGNOSIS:   Right breast cancer, 12 o'clock position (T1, N0), Left breast cancer, 2 o'clock (Tis, N0)  PROCEDURE:   Procedure(s): RIGHT BREAST LUMPECTOMY WITH RADIOACTIVE SEED AND RIGHT AXILLARY SENTINEL LYMPH NODE BIOPSY, Injection of peri areolar area of breast with methylene blue on right side only (1.0 cc) LEFT BREAST LUMPECTOMY WITH TWO NEEDLE LOCALIZATION BRACKET  SURGEON:   Alphonsa Overall, M.D.  ANESTHESIA:   general  Anesthesiologist: Rod Mae, MD CRNA: Franne Forts Solheim, CRNA; Nada Maclachlan Wall, CRNA  General  EBL:  200  ml  DRAINS: none   LOCAL MEDICATIONS USED:   20 cc 1/4% marcaine on left,  Pectoral block on right  SPECIMEN:   Right breast lumpectomy (suture medial), right axillary sentinel lymph node biopsy (counts - 240 and blue, background - 0)  COUNTS CORRECT:  YES  INDICATIONS FOR PROCEDURE:  Brianna Jones is a 65 y.o. (DOB: 01/25/1951) AA  female whose primary care physician is Maximino Greenland, MD and comes for bilateral  breast lumpectomy and right axillary sentinel lymph node biopsy.   Ms. Cangemi was seen in the [facility] with Drs. Gudena and Kinard.  She has an invasive ductal ca on the right and DCIS in the left breast.  Because of her breast size, she has seen Dr. Iran Planas for planned delayed breast reduction post lumpectomy.  She has been marked and i discussed the surgicals plans with Dr. Iran Planas.        The options for breast cancer treatment have been discussed with the patient. She elected to proceed with lumpectomy and axillary sentinel lymph node.     The indications and potential complications of surgery were explained to the patient. Potential complications include, but are not limited to, bleeding, infection, the need for further surgery, and nerve injury.     She had a I131 seed placed  on 02/22/2015 in her right breast at [facility].  I confirmed the presence of the I131 seed in the pre op area using the Neoprobe.  The seed is in the 12 o'clock position of the right breast.   In the holding area, her right areola was injected with 1 millicurie of Technitium Sulfur Colloid.   She had two wires placed in her left breast today, bracketing the DCIS.  OPERATIVE NOTE:   The patient was taken to room # 2 at [facility] where she underwent a general anesthesia  supervised by Anesthesiologist: Rod Mae, MD CRNA: Franne Forts Solheim, CRNA; Collier Bullock, CRNA. both breast and axilla were prepped with  ChloraPrep and sterilely draped.    A time-out and the surgical check list was reviewed.    I injected about 1.0 mL of 40% methylene blue around her right areola.   I turned attention to the cancer which was about at the 12 o'clock position of the right breast.   I used the Neoprobe to identify the I131 seed.  I tried to excise an area around the tumor of at least 1 cm.    I excised this block of breast tissue approximately 5 cm by 5 cm  in diameter.   I painted the lumpectomy specimen with the 6 color paint kit and did a specimen mammogram which confirmed the mass, clip, and the seed were all in the right position in the specimen.  The specimen was sent to  pathology who called back to confirm that they have the seed and the specimen.   I then started the right axillary sentinel lymph node biopsy. I made an incision in the right axilla.  I found a hot area at the junction of the breast and the pectoralis major muscle. I cut down and  identified a hot node that had counts of 240 and the background has 0 counts. The lymph node was blue. I checked her internal mammary nodes and supraclavicular nodes with the neoprobe and found no other hot area. The axillary node was then sent to pathology.    She has 2 wires coming out of the UOQ of her left breast at the 2 o'clock position. I  excised this block of breast tissue approximately 6 cm by 8 cm  in diameter.   I painted the lumpectomy specimen with the 6 color paint kit and did a specimen mammogram which confirmed the calcifications, the two wires, and the clip were all in the right position in the specimen.   I then irrigated both wounds with saline. I infiltrated approximately 20 mL of 1/4% local in the left breast lumpectomy.  She had a block in the right breast.   I did not mark the tumor beds, because Dr. Iran Planas is going to do a reduction in about a week when the pathology is returned.  I then closed all the wounds in layers using 3-0 Vicryl sutures for the deep layer. At the skin, I closed the incisions with a 5-0 Monocryl suture. The incisions were then painted with LiquiBand.  She had gauze place over the wounds and placed in a breast binder.   The patient tolerated the procedure well, was transported to the recovery room in good condition. Sponge and needle count were correct at the end of the case.   Final pathology is pending.   Alphonsa Overall, MD, [facility] Pager: [phone] Office phone:  [phone]

## 2015-02-24 ENCOUNTER — Encounter (HOSPITAL_COMMUNITY): Payer: Self-pay | Admitting: Surgery

## 2015-02-24 DIAGNOSIS — I10 Essential (primary) hypertension: Secondary | ICD-10-CM | POA: Diagnosis not present

## 2015-02-24 DIAGNOSIS — D0512 Intraductal carcinoma in situ of left breast: Secondary | ICD-10-CM | POA: Diagnosis not present

## 2015-02-24 DIAGNOSIS — M1990 Unspecified osteoarthritis, unspecified site: Secondary | ICD-10-CM | POA: Diagnosis not present

## 2015-02-24 DIAGNOSIS — M549 Dorsalgia, unspecified: Secondary | ICD-10-CM | POA: Diagnosis not present

## 2015-02-24 DIAGNOSIS — G629 Polyneuropathy, unspecified: Secondary | ICD-10-CM | POA: Diagnosis not present

## 2015-02-24 DIAGNOSIS — Z171 Estrogen receptor negative status [ER-]: Secondary | ICD-10-CM | POA: Diagnosis not present

## 2015-02-24 DIAGNOSIS — E7800 Pure hypercholesterolemia, unspecified: Secondary | ICD-10-CM | POA: Diagnosis not present

## 2015-02-24 DIAGNOSIS — D0501 Lobular carcinoma in situ of right breast: Secondary | ICD-10-CM | POA: Diagnosis not present

## 2015-02-24 NOTE — Progress Notes (Signed)
Brianna Jones to be D/C'd home per MD order. Discussed with the patient and all questions fully answered.  VSS, Surgical sites clean, dry, intact with dressings and breast binder in place.   IV catheter discontinued intact. Site without signs and symptoms of complications. Dressing and pressure applied.  An After Visit Summary was printed and given to the patient. Patient received prescription prior to hospitalization.  D/c education completed with patient/family including follow up instructions, medication list, d/c activities limitations if indicated, with other d/c instructions as indicated by MD - patient able to verbalize understanding, all questions fully answered.   Patient instructed to return to ED, call 911, or call MD for any changes in condition.   Patient to be escorted via [place], and D/C home via private auto.

## 2015-02-24 NOTE — Discharge Summary (Addendum)
Physician Discharge Summary  Patient ID:  Brianna Jones  MRN: [id number]  DOB/AGE: [birth year] 65 y.o.  Admit date: 02/23/2015 Discharge date: 02/24/2015  Discharge Diagnoses:  1 BREAST CANCER, STAGE 1, RIGHT (C50.911)  Bilateral breast biopsies on 01/06/2015 ([id number]) - right breast - invasive lobular ca with LCIS, ER - 95%, PR - 0%, not enough tissue for other things.  Left breast - DCIS, ER - 100%, PR - 80% Oncology - [place]/Kinard Final path pending  2. BREAST CANCER, STAGE 0, LEFT (D05.92) Story: Bilateral breast biopsies on 01/06/2015 ([id number]) - right breast - invasive lobular ca with LCIS, ER - 95%, PR - 0%, not enough tissue for other things.  Left breast - DCIS, ER - 100%, PR - 80% Final path pending  3. L5 neuropathy  on gabapentin - seen by neurologist in [place] 4. HTN > 20 years  Active Problems:   Bilateral breast cancer ([place])   Operation: Procedure(s): RIGHT BREAST LUMPECTOMY WITH RADIOACTIVE SEED AND RIGHT AXILLARY SENTINEL LYMPH NODE BIOPSY LEFT BREAST LUMPECTOMY WITH TWO NEEDLE LOCALIZATION BRACKET on 02/23/2015 - D. [name]  Discharged Condition: good  Hospital Course: Brianna Jones is an 65 y.o. female whose primary care physician is Maximino Greenland, MD and who was admitted 02/23/2015 with a chief complaint of bilateral breast cancers. She is to get bilateral breast reductions, once we are sure the margins are clear.  I met with Dr. Iran Planas in the holding area to mark her breast and plan her surgery.    She was brought to the operating room on 02/23/2015 and underwent  RIGHT BREAST LUMPECTOMY WITH RADIOACTIVE SEED AND RIGHT AXILLARY SENTINEL LYMPH NODE BIOPSY, LEFT BREAST LUMPECTOMY WITH TWO NEEDLE LOCALIZATION BRACKET. Because of her age and her concern about bilateral procedures, she was kept overnight for observation. She is now ready to go home.  Her sister and daughter are in the room  with her.  The discharge instructions were reviewed with the patient.  Consults: None  Significant Diagnostic Studies: Results for orders placed or performed during the hospital encounter of 02/15/15  CBC WITH DIFFERENTIAL  Result Value Ref Range   WBC 7.7 4.0 - 10.5 K/uL   RBC 4.51 3.87 - 5.11 MIL/uL   Hemoglobin 13.2 12.0 - 15.0 g/dL   HCT 40.0 36.0 - 46.0 %   MCV 88.7 78.0 - 100.0 fL   MCH 29.3 26.0 - 34.0 pg   MCHC 33.0 30.0 - 36.0 g/dL   RDW 15.4 11.5 - 15.5 %   Platelets 185 150 - 400 K/uL   Neutrophils Relative % 65 %   Neutro Abs 5.1 1.7 - 7.7 K/uL   Lymphocytes Relative 28 %   Lymphs Abs 2.2 0.7 - 4.0 K/uL   Monocytes Relative 3 %   Monocytes Absolute 0.2 0.1 - 1.0 K/uL   Eosinophils Relative 3 %   Eosinophils Absolute 0.2 0.0 - 0.7 K/uL   Basophils Relative 1 %   Basophils Absolute 0.0 0.0 - 0.1 K/uL  Comprehensive metabolic panel  Result Value Ref Range   Sodium 140 135 - 145 mmol/L   Potassium 3.9 3.5 - 5.1 mmol/L   Chloride 102 101 - 111 mmol/L   CO2 29 22 - 32 mmol/L   Glucose, Bld 94 65 - 99 mg/dL   BUN 15 6 - 20 mg/dL   Creatinine, Ser 0.84 0.44 - 1.00 mg/dL   Calcium 10.3 8.9 - 10.3 mg/dL   Total Protein 6.9 6.5 - 8.1 g/dL   Albumin  3.3 (L) 3.5 - 5.0 g/dL   AST 25 15 - 41 U/L   ALT 27 14 - 54 U/L   Alkaline Phosphatase 63 38 - 126 U/L   Total Bilirubin 0.5 0.3 - 1.2 mg/dL   GFR calc non Af Amer >60 >60 mL/min   GFR calc Af Amer >60 >60 mL/min   Anion gap 9 5 - 15    Mr Breast Bilateral W Wo Contrast  01/26/2015  ADDENDUM REPORT: 01/26/2015 15:54 ADDENDUM: After discussion with Dr. [name], bracketing of the linear oriented calcifications (biopsy proven DCIS) spanning nearly 5 cm in the upper-outer left breast on day of scheduled lumpectomy will be performed given patient's very large breast size and desire for reduction with breast conservation rather than an additional stereotactic guided biopsy. Electronically Signed   By: Everlean Alstrom M.D.    On: 01/26/2015 15:54  01/26/2015  CLINICAL DATA:  65 year old female with recently diagnosed bilateral breast cancer found on routine screening mammography. Ultrasound-guided biopsy of a 1.1 cm mass at the 12:30 location of the right breast 01/06/2015 demonstrated grade 1 invasive lobular carcinoma and lobular carcinoma in situ with necrosis and calcifications. Stereotactic guided biopsy of left breast linear oriented calcifications also on 01/06/2015 demonstrated intermediate to high grade ductal carcinoma in situ with necrosis and calcifications. LABS:  Not applicable. EXAM: BILATERAL BREAST MRI WITH AND WITHOUT CONTRAST TECHNIQUE: Multiplanar, multisequence MR images of both breasts were obtained prior to and following the intravenous administration of 18 ml of MultiHance. THREE-DIMENSIONAL MR IMAGE RENDERING ON INDEPENDENT WORKSTATION: Three-dimensional MR images were rendered by post-processing of the original MR data on an independent workstation. The three-dimensional MR images were interpreted, and findings are reported in the following complete MRI report for this study. Three dimensional images were evaluated at the independent DynaCad workstation COMPARISON:  Previous exam(s). FINDINGS: Breast composition: b.  Scattered fibroglandular tissue. Background parenchymal enhancement: Mild to moderate. Right breast: Irregular enhancing mass at the approximate 12:30 location of the right breast anterior depth compatible with biopsy proven malignancy measures 1.1 cm AP, 1 cm transverse, and 1 cm craniocaudal. Biopsy clip artifact is present along the inferior portion of this mass. No additional enhancing masses or abnormal areas of enhancement seen in the right breast to suggest additional sites of disease. Left breast: Biopsy clip artifact is present in the upper-outer left breast anterior depth at site of biopsy proven malignancy. Clumped linear enhancement extending posteriorly superiorly from the biopsy  marking clip in the left breast measures approximately 5 cm in an AP dimension and corresponds with the calcifications seen on mammography. Lymph nodes: No morphologically abnormal axillary lymph nodes. No internal mammary lymphadenopathy. Ancillary findings:  None. IMPRESSION: 1. Right breast malignancy at the 12:30 location anterior depth measures approximately 1.1 cm. No additional findings in the right breast to suggest multifocal or multicentric disease. 2. Left breast malignancy measures nearly 5 cm with clumped linear enhancement extending posterior superiorly from the biopsy marking clip in the upper-outer left breast. RECOMMENDATION: Treatment plan for bilateral breast malignancies. If desired for breast conservation/surgical planning purposes, stereotactic guided biopsy of the most posterior calcifications in the left breast could be performed. BI-RADS CATEGORY  6: Known biopsy-proven malignancy. Electronically Signed: By: Everlean Alstrom M.D. On: 01/26/2015 11:19   Nm Sentinel Node Inj-no Rpt (breast)  02/23/2015  CLINICAL DATA: right breast cancer  (only doing sentinel lymph node on the right) Sulfur colloid was injected intradermally by the nuclear medicine technologist for breast cancer sentinel node  localization.   Mm Breast Surgical Specimen  02/23/2015  CLINICAL DATA:  Patient with history of left breast DCIS for lumpectomy. EXAM: SPECIMEN RADIOGRAPH OF THE LEFT BREAST COMPARISON:  Previous exam(s). FINDINGS: Status post excision of the left breast. The wire tips and biopsy marker clip are present and are marked for pathology. IMPRESSION: Specimen radiograph of the left breast. Electronically Signed   By: Lovey Newcomer M.D.   On: 02/23/2015 12:20   Mm Breast Surgical Specimen  02/23/2015  CLINICAL DATA:  Recent diagnosis of right breast cancer. Radioactive seed localization was performed 02/21/2015. EXAM: SPECIMEN RADIOGRAPH OF THE RIGHT BREAST COMPARISON:  Previous exam(s). FINDINGS: Status  post excision of the right breast. The radioactive seed and biopsy marker clip are present, completely intact, and were marked for pathology. The cancer is visible on the specimen radiograph. IMPRESSION: Specimen radiograph of the right breast. Electronically Signed   By: Curlene Dolphin M.D.   On: 02/23/2015 11:50   Mm Rt Radioactive Seed Loc Mammo Guide  02/21/2015  CLINICAL DATA:  New radioactive seed localization requested of the right breast for recent diagnosis of right breast cancer. EXAM: MAMMOGRAPHIC GUIDED RADIOACTIVE SEED LOCALIZATION OF THE RIGHT BREAST COMPARISON:  Previous exam(s). FINDINGS: Patient presents for radioactive seed localization prior to lumpectomy. I met with the patient and we discussed the procedure of seed localization including benefits and alternatives. We discussed the high likelihood of a successful procedure. We discussed the risks of the procedure including infection, bleeding, tissue injury and further surgery. We discussed the low dose of radioactivity involved in the procedure. Informed, written consent was given. The usual time-out protocol was performed immediately prior to the procedure. Using mammographic guidance, sterile technique, 1% lidocaine and an I-125 radioactive seed, a mass with calcifications and associated ribbon shaped biopsy clip were localized using a superior to inferior approach. The follow-up mammogram images confirm the seed in the expected location and were marked for Dr. [name]. Follow-up survey of the patient confirms presence of the radioactive seed. Order number of I-125 seed:  [id number]. Total activity:  7.989 millicuries  Reference Date: 17 January 2015 The patient tolerated the procedure well and was released from the [place]. She was given instructions regarding seed removal. IMPRESSION: Radioactive seed localization right breast. No apparent complications. Electronically Signed   By: Curlene Dolphin M.D.   On: 02/21/2015 15:14   Mm Lt  Plc Breast Loc Dev   1st Lesion  Inc Mammo Guide  02/23/2015  CLINICAL DATA:  Patient with left breast DCIS. For preoperative localization prior to lumpectomy. EXAM: NEEDLE LOCALIZATION OF THE LEFT BREAST WITH MAMMO GUIDANCE COMPARISON:  Previous exams. FINDINGS: Patient presents for needle localization prior to left breast lumpectomy. I met with the patient and we discussed the procedure of needle localization including benefits and alternatives. We discussed the high likelihood of a successful procedure. We discussed the risks of the procedure, including infection, bleeding, tissue injury, and further surgery. Informed, written consent was given. The usual time-out protocol was performed immediately prior to the procedure. Using mammographic guidance, sterile technique, 1% lidocaine and a 7 cm modified Kopans needle, the anterior aspect of the left breast calcifications were localized using cranial approach. Using mammographic guidance, sterile technique, 1% lidocaine and a 7 cm modified Kopans needle, the posterior aspect of the left breast calcifications were localized using cranial approach. The images were marked for Dr. [name]. IMPRESSION: Needle localization left breast. No apparent complications. Electronically Signed   By: Lovey Newcomer  M.D.   On: 02/23/2015 08:31   Mm Lt Plc Breast Loc Dev   Ea Add Lesion  Inc Mammo Guide  02/23/2015  CLINICAL DATA:  Patient with left breast DCIS. For preoperative localization prior to lumpectomy. EXAM: NEEDLE LOCALIZATION OF THE LEFT BREAST WITH MAMMO GUIDANCE COMPARISON:  Previous exams. FINDINGS: Patient presents for needle localization prior to left breast lumpectomy. I met with the patient and we discussed the procedure of needle localization including benefits and alternatives. We discussed the high likelihood of a successful procedure. We discussed the risks of the procedure, including infection, bleeding, tissue injury, and further surgery. Informed, written  consent was given. The usual time-out protocol was performed immediately prior to the procedure. Using mammographic guidance, sterile technique, 1% lidocaine and a 7 cm modified Kopans needle, the anterior aspect of the left breast calcifications were localized using cranial approach. Using mammographic guidance, sterile technique, 1% lidocaine and a 7 cm modified Kopans needle, the posterior aspect of the left breast calcifications were localized using cranial approach. The images were marked for Dr. [name]. IMPRESSION: Needle localization left breast. No apparent complications. Electronically Signed   By: Lovey Newcomer M.D.   On: 02/23/2015 08:31    Discharge Exam:  Filed Vitals:   02/24/15 0200 02/24/15 0458  BP: 107/69 118/81  Pulse: 71 86  Temp: 98.6 F (37 C) 99.1 F (37.3 C)  Resp: 17 18    General: WN older AA F who is alert and generally healthy appearing.  Lungs: Clear to auscultation and symmetric breath sounds. Heart:  RRR. No murmur or rub. Breasts:  Both incisions covered and dressings dry.  Discharge Medications:     Medication List    ASK your doctor about these medications        amLODipine 10 MG tablet  Commonly known as:  NORVASC  Take 10 mg by mouth daily.     cyclobenzaprine 10 MG tablet  Commonly known as:  FLEXERIL  Take 10 mg by mouth 3 (three) times daily as needed.     gabapentin 300 MG capsule  Commonly known as:  NEURONTIN  Take 300-600 mg by mouth 2 (two) times daily. 1 capsule in the morning, 2 capsules at bedtime     HYDROcodone-acetaminophen 5-325 MG tablet  Commonly known as:  NORCO/VICODIN  Take 1 tablet by mouth every 8 (eight) hours as needed.     naproxen sodium 550 MG tablet  Commonly known as:  ANAPROX  Take 550 mg by mouth 2 (two) times daily as needed for mild pain.     tiZANidine 4 MG tablet  Commonly known as:  ZANAFLEX  Take 4 mg by mouth at bedtime.     valsartan-hydrochlorothiazide 160-25 MG tablet  Commonly known as:   DIOVAN-HCT  Take 1 tablet by mouth daily.     Vitamin D3 5000 units Caps  Take 1 capsule by mouth daily.        Disposition: Final discharge disposition not confirmed   Activity:  Driving - May drive in 2 or 3 days, if doing well   Lifting - No lifting more than 15 pounds for 1 week, then no limit  Wound Care:   May shower on Saturday, 02/25/2015  Diet:  As tolerated  Follow up appointment:  Call Dr. Pollie Friar office [facility]) at [phone] for an appointment in 2 to 3 weeks.  Medications and dosages:  Resume your home medications.  You have a prescription for:  Vicodin  Signed: Alphonsa Overall, M.D., [facility] Office:  [phone]  02/24/2015, 7:10 AM

## 2015-02-27 DIAGNOSIS — C50911 Malignant neoplasm of unspecified site of right female breast: Secondary | ICD-10-CM | POA: Diagnosis not present

## 2015-02-28 ENCOUNTER — Encounter (HOSPITAL_BASED_OUTPATIENT_CLINIC_OR_DEPARTMENT_OTHER): Payer: Self-pay | Admitting: *Deleted

## 2015-03-01 NOTE — H&P (Signed)
  Subjective:    Patient ID: Brianna Jones is a 65 y.o. female.  HPI  Patient of Drs. Shella Maxim, and Kinard with recent diagnosis bilateral breast ca. Presented following screening MMG. On [place], the tumor measured 1.2 cm and was present in the upper inner quadrant. Biopsy RIGHT breast : 12:30: ILC ER +, PR -, insufficient tissue for Her2; LCIS with necrosis and calcifications; LEFT breast biopsy upper outer quadrant: DCIS ER/PR +. She is now one week post bilateral lumpectomy with right SLN. Pathology with DCIS on left clear margins, and no further invasive disease noted on right, LCIS present, SLN negative. Plan bilateral oncoplastic reduction.   MRI noted right breast known malignancy at the 12:30 location anterior depth measures approximately 1.1 cm; left breast malignancy measures nearly 5 cm with clumped linear enhancement extending posterior superiorly from the biopsy clip in the upper-outer left breast. Plan for bilateral breast conservation/lumpectomy.Given breast size and desire for conservation, here to discuss oncoplastic reconstruction, per recommendation Dr. Sondra Come to aid with radiation treatment.  Chronic pain medication for sciatica, takes only at night. Works full time [employer], planning 4-6 weeks off for surgery.  Current 38 DDD Wt stable   Review of Systems  HENT: Positive for sinus pressure.  Musculoskeletal: Positive for arthralgias, back pain and joint swelling.       Objective:   Physical Exam  Constitutional: She is oriented to person, place, and time.  Cardiovascular: Normal rate and regular rhythm.  Pulmonary/Chest: Effort normal and breath sounds normal.  Abdominal: Soft.  Lymphadenopathy:  She has no axillary adenopathy.  Neurological: She is alert and oriented to person, place, and time.   No masses palpable breasts, grade 2 ptosis    SN to nipple R 34 L 34 cm BW R 23 L 23 cm Nipple to IMF R 17 L 17 cm  Assessment:     R breast ca  ILC Left breast DCIS    Plan:      Reviewed reduction with anchor type scars, drains, post operative visits and limitations, recovery. Diminished sensation nipple and breast skin, risk of nipple loss, wound healing problems, asymmetry. She will require XRT and smaller breast size may aid with this. Discussed will have some contraction of breast volume and increased firmness with radiation, less ptosis with aging. Discussed changes with wt gain, loss, aging.  If she pursues partial mastectomy, highly encourage her to pursue breast reduction prior to XRT as risk of complications post would be very high.   Irene Limbo, MD [facility] [phone]

## 2015-03-02 ENCOUNTER — Ambulatory Visit (HOSPITAL_BASED_OUTPATIENT_CLINIC_OR_DEPARTMENT_OTHER): Payer: Medicare Other | Admitting: Hematology and Oncology

## 2015-03-02 ENCOUNTER — Encounter: Payer: Self-pay | Admitting: Hematology and Oncology

## 2015-03-02 ENCOUNTER — Telehealth: Payer: Self-pay | Admitting: Hematology and Oncology

## 2015-03-02 VITALS — BP 119/79 | HR 115 | Temp 97.9°F | Resp 18 | Ht 62.0 in | Wt 192.0 lb

## 2015-03-02 DIAGNOSIS — D0512 Intraductal carcinoma in situ of left breast: Secondary | ICD-10-CM | POA: Diagnosis not present

## 2015-03-02 DIAGNOSIS — C50211 Malignant neoplasm of upper-inner quadrant of right female breast: Secondary | ICD-10-CM

## 2015-03-02 DIAGNOSIS — C50412 Malignant neoplasm of upper-outer quadrant of left female breast: Secondary | ICD-10-CM

## 2015-03-02 NOTE — Assessment & Plan Note (Addendum)
Left lumpectomy: DCIS with calcifications, 4 mm margin, ER 95%, PR 0%, HER-2 insufficient,  Pathological stage: Tis N0 stage 0  Pathology counseling: I discussed the final pathology report of the patient provided  a copy of this report. I discussed the margins as well as lymph node surgeries. We also discussed the final staging along with previously performed ER/PR testing.  Recommendation: 1. Adjuvant radiation therapy 2. Followed by antiestrogen therapy with anastrozole 5 years  Return to clinic in 3 months for follow-up to start antiestrogen treatment.

## 2015-03-02 NOTE — Telephone Encounter (Signed)
Appointments made and avs printed for patient °

## 2015-03-02 NOTE — Assessment & Plan Note (Signed)
Right lumpectomy 02/23/2015: ALH, LCIS, 0/1 lymph node

## 2015-03-02 NOTE — Progress Notes (Signed)
Patient Care Team: Glendale Chard, MD as PCP - General (Internal Medicine) Alphonsa Overall, MD as Consulting Physician (General Surgery) Nicholas Lose, MD as Consulting Physician (Hematology and Oncology) Gery Pray, MD as Consulting Physician (Radiation Oncology) Sylvan Cheese, NP as Nurse Practitioner (Hematology and Oncology) Irene Limbo, MD as Consulting Physician (Plastic Surgery)  DIAGNOSIS: Breast cancer of upper-inner quadrant of right female breast [facility])   Staging form: Breast, AJCC 7th Edition     Clinical stage from 01/18/2015: Stage IA (T1c, N0, M0) - Unsigned Breast cancer of upper-outer quadrant of left female breast [facility])   Staging form: Breast, AJCC 7th Edition     Clinical stage from 01/18/2015: Stage 0 (Tis (DCIS), N0, M0) - Unsigned  SUMMARY OF ONCOLOGIC HISTORY:   Breast cancer of upper-inner quadrant of right female breast (Brianna Jones)   12/30/2014 Mammogram Right breast mass at 12:30: 1 x 0.6 x 1.1 cm; left breast calcifications spanning 4 cm with a titer group measuring 5 mm, no pathologic lymph nodes   01/06/2015 Initial Diagnosis RIGHT breast biopsy: 12:30: Invasive lobular carcinoma grade 1, ER 95%, PR 0% insufficient for her to our Ki-67; LCIS with necrosis and calcifications; LEFT breast biopsy upper outer quadrant: DCIS with necrosis and calcifications, ER 100%, PR 80%   02/23/2015 Surgery Right lumpectomy: ALH, LCIS, 0/1 lymph node    Breast cancer of upper-outer quadrant of left female breast (Brianna Jones)   02/23/2015 Surgery Left lumpectomy: DCIS with calcifications, 4 mm margin, ER 95%, PR 0%, HER-2 insufficient, Tis N0 stage 0    CHIEF COMPLIANT: follow-up after surgery  INTERVAL HISTORY: Brianna Jones is a 65 year old lady with above-mentioned history of right breast invasive lobular cancer left breast DCIS who underwent bilateral lumpectomies. She is here for a postoperative follow-up and reports that she is feeling quite well. Her breast surgery  appears to be healing well. She is scheduled to undergo breast reduction surgery tomorrow.   REVIEW OF SYSTEMS:   Constitutional: Denies fevers, chills or abnormal weight loss Eyes: Denies blurriness of vision Ears, nose, mouth, throat, and face: Denies mucositis or sore throat Respiratory: Denies cough, dyspnea or wheezes Cardiovascular: Denies palpitation, chest discomfort Gastrointestinal:  Denies nausea, heartburn or change in bowel habits Skin: Denies abnormal skin rashes Lymphatics: Denies new lymphadenopathy or easy bruising Neurological:Denies numbness, tingling or new weaknesses Behavioral/Psych: Mood is stable, no new changes  Extremities: No lower extremity edema Breast: mild soreness from recent surgery All other systems were reviewed with the patient and are negative.  I have reviewed the past medical history, past surgical history, social history and family history with the patient and they are unchanged from previous note.  ALLERGIES:  has No Known Allergies.  MEDICATIONS:  Current Outpatient Prescriptions  Medication Sig Dispense Refill  . amLODipine (NORVASC) 10 MG tablet Take 10 mg by mouth daily.    . Cholecalciferol (VITAMIN D3) 5000 UNITS CAPS Take 1 capsule by mouth daily.     . cyclobenzaprine (FLEXERIL) 10 MG tablet Take 10 mg by mouth 3 (three) times daily as needed.  6  . gabapentin (NEURONTIN) 300 MG capsule Take 300-600 mg by mouth 2 (two) times daily. 1 capsule in the morning, 2 capsules at bedtime    . HYDROcodone-acetaminophen (NORCO/VICODIN) 5-325 MG tablet Take 1 tablet by mouth every 8 (eight) hours as needed.  0  . tiZANidine (ZANAFLEX) 4 MG tablet Take 4 mg by mouth at bedtime.   6  . valsartan-hydrochlorothiazide (DIOVAN-HCT) 160-25 MG tablet Take 1  tablet by mouth daily.     No current facility-administered medications for this visit.    PHYSICAL EXAMINATION: ECOG PERFORMANCE STATUS: 1 - Symptomatic but completely ambulatory  Filed Vitals:     03/02/15 1333  BP: 119/79  Pulse: 115  Temp: 97.9 F (36.6 C)  Resp: 18   Filed Weights   03/02/15 1333  Weight: 192 lb (87.091 kg)    GENERAL:alert, no distress and comfortable SKIN: skin color, texture, turgor are normal, no rashes or significant lesions EYES: normal, Conjunctiva are pink and non-injected, sclera clear OROPHARYNX:no exudate, no erythema and lips, buccal mucosa, and tongue normal  NECK: supple, thyroid normal size, non-tender, without nodularity LYMPH:  no palpable lymphadenopathy in the cervical, axillary or inguinal LUNGS: clear to auscultation and percussion with normal breathing effort HEART: regular rate & rhythm and no murmurs and no lower extremity edema ABDOMEN:abdomen soft, non-tender and normal bowel sounds MUSCULOSKELETAL:no cyanosis of digits and no clubbing  NEURO: alert & oriented x 3 with fluent speech, no focal motor/sensory deficits EXTREMITIES: No lower extremity edema LABORATORY DATA:  I have reviewed the data as listed   Chemistry      Component Value Date/Time   NA 140 02/15/2015 1433   NA 140 01/18/2015 0827   K 3.9 02/15/2015 1433   K 3.6 01/18/2015 0827   CL 102 02/15/2015 1433   CO2 29 02/15/2015 1433   CO2 28 01/18/2015 0827   BUN 15 02/15/2015 1433   BUN 11.8 01/18/2015 0827   CREATININE 0.84 02/15/2015 1433   CREATININE 0.9 01/18/2015 0827      Component Value Date/Time   CALCIUM 10.3 02/15/2015 1433   CALCIUM 10.0 01/18/2015 0827   ALKPHOS 63 02/15/2015 1433   ALKPHOS 75 01/18/2015 0827   AST 25 02/15/2015 1433   AST 25 01/18/2015 0827   ALT 27 02/15/2015 1433   ALT 22 01/18/2015 0827   BILITOT 0.5 02/15/2015 1433   BILITOT 0.35 01/18/2015 0827       Lab Results  Component Value Date   WBC 7.7 02/15/2015   HGB 13.2 02/15/2015   HCT 40.0 02/15/2015   MCV 88.7 02/15/2015   PLT 185 02/15/2015   NEUTROABS 5.1 02/15/2015   ASSESSMENT & PLAN:  Breast cancer of upper-outer quadrant of left female breast  ([facility]) Left lumpectomy: DCIS with calcifications, 4 mm margin, ER 95%, PR 0%, HER-2 insufficient,  Pathological stage: Tis N0 stage 0  Pathology counseling: I discussed the final pathology report of the patient provided  a copy of this report. I discussed the margins as well as lymph node surgeries. We also discussed the final staging along with previously performed ER/PR testing.  Recommendation: 1. Adjuvant radiation therapy 2. Followed by antiestrogen therapy with anastrozole 5 years  Breast cancer of upper-inner quadrant of right female breast ([place]) RIGHT breast biopsy: 12:30: Invasive lobular carcinoma grade 1, ER 95%, PR 0% insufficient for her to our Ki-67; LCIS with necrosis and calcifications; LEFT breast biopsy upper outer quadrant: DCIS with necrosis and calcifications, ER 100%, PR 80% Right lumpectomy 02/23/2015: ALH, LCIS, 0/1 lymph node Pathological staging: T1a N0 stage IA  Recommendation: 1. Adjuvant radiation therapy 2. Followed by antiestrogen therapy with anastrozole 1 mg daily 5 years  Return to clinic in mid May 2017 for follow-up to start antiestrogen treatment.  No orders of the defined types were placed in this encounter.   The patient has a good understanding of the overall plan. she agrees with it. she will  call with any problems that may develop before the next visit here.   Rulon Eisenmenger, MD 03/02/2015

## 2015-03-03 ENCOUNTER — Encounter (HOSPITAL_BASED_OUTPATIENT_CLINIC_OR_DEPARTMENT_OTHER): Payer: Self-pay | Admitting: *Deleted

## 2015-03-03 ENCOUNTER — Ambulatory Visit (HOSPITAL_BASED_OUTPATIENT_CLINIC_OR_DEPARTMENT_OTHER): Payer: Medicare Other | Admitting: Anesthesiology

## 2015-03-03 ENCOUNTER — Ambulatory Visit (HOSPITAL_BASED_OUTPATIENT_CLINIC_OR_DEPARTMENT_OTHER)
Admission: RE | Admit: 2015-03-03 | Discharge: 2015-03-04 | Disposition: A | Discharge status: Home / Self Care | Payer: Medicare Other | Source: Ambulatory Visit | Attending: Plastic Surgery | Admitting: Plastic Surgery

## 2015-03-03 ENCOUNTER — Encounter (HOSPITAL_BASED_OUTPATIENT_CLINIC_OR_DEPARTMENT_OTHER)
Admission: RE | Disposition: A | Discharge status: Home / Self Care | Payer: Self-pay | Source: Ambulatory Visit | Attending: Plastic Surgery

## 2015-03-03 DIAGNOSIS — E669 Obesity, unspecified: Secondary | ICD-10-CM | POA: Insufficient documentation

## 2015-03-03 DIAGNOSIS — I10 Essential (primary) hypertension: Secondary | ICD-10-CM | POA: Insufficient documentation

## 2015-03-03 DIAGNOSIS — N62 Hypertrophy of breast: Secondary | ICD-10-CM | POA: Diagnosis not present

## 2015-03-03 DIAGNOSIS — M1990 Unspecified osteoarthritis, unspecified site: Secondary | ICD-10-CM | POA: Insufficient documentation

## 2015-03-03 DIAGNOSIS — C50911 Malignant neoplasm of unspecified site of right female breast: Secondary | ICD-10-CM | POA: Diagnosis present

## 2015-03-03 DIAGNOSIS — Z170 Estrogen receptor positive status [ER+]: Secondary | ICD-10-CM | POA: Diagnosis not present

## 2015-03-03 DIAGNOSIS — N651 Disproportion of reconstructed breast: Secondary | ICD-10-CM | POA: Diagnosis not present

## 2015-03-03 DIAGNOSIS — N6011 Diffuse cystic mastopathy of right breast: Secondary | ICD-10-CM | POA: Diagnosis not present

## 2015-03-03 DIAGNOSIS — Z483 Aftercare following surgery for neoplasm: Secondary | ICD-10-CM | POA: Diagnosis not present

## 2015-03-03 DIAGNOSIS — C50211 Malignant neoplasm of upper-inner quadrant of right female breast: Secondary | ICD-10-CM | POA: Insufficient documentation

## 2015-03-03 DIAGNOSIS — N6012 Diffuse cystic mastopathy of left breast: Secondary | ICD-10-CM | POA: Diagnosis not present

## 2015-03-03 DIAGNOSIS — D0512 Intraductal carcinoma in situ of left breast: Secondary | ICD-10-CM | POA: Diagnosis not present

## 2015-03-03 DIAGNOSIS — Z6834 Body mass index (BMI) 34.0-34.9, adult: Secondary | ICD-10-CM | POA: Diagnosis not present

## 2015-03-03 HISTORY — PX: BREAST REDUCTION SURGERY: SHX8

## 2015-03-03 SURGERY — MAMMOPLASTY, REDUCTION
Anesthesia: General | Site: Breast | Laterality: Bilateral

## 2015-03-03 MED ORDER — SUFENTANIL CITRATE 50 MCG/ML IV SOLN
INTRAVENOUS | Status: DC | PRN
  Administered 2015-03-03: 10 ug via INTRAVENOUS
  Administered 2015-03-03 (×2): 5 ug via INTRAVENOUS
  Administered 2015-03-03: 10 ug via INTRAVENOUS
  Administered 2015-03-03: 5 ug via INTRAVENOUS

## 2015-03-03 MED ORDER — PHENYLEPHRINE 40 MCG/ML (10ML) SYRINGE FOR IV PUSH (FOR BLOOD PRESSURE SUPPORT)
PREFILLED_SYRINGE | INTRAVENOUS | Status: AC
  Filled 2015-03-03: qty 10

## 2015-03-03 MED ORDER — ONDANSETRON 4 MG PO TBDP: 4.0000 mg | ORAL_TABLET | Freq: Four times a day (QID) | ORAL | Status: DC | PRN

## 2015-03-03 MED ORDER — CYCLOBENZAPRINE HCL 10 MG PO TABS
10.0000 mg | ORAL_TABLET | Freq: Three times a day (TID) | ORAL | Status: DC | PRN
Start: 2015-03-03 — End: 2015-03-04

## 2015-03-03 MED ORDER — PHENYLEPHRINE HCL 10 MG/ML IJ SOLN
INTRAMUSCULAR | Status: DC | PRN
  Administered 2015-03-03: 80 ug via INTRAVENOUS

## 2015-03-03 MED ORDER — SODIUM CHLORIDE 0.9 % IJ SOLN
INTRAMUSCULAR | Status: AC
  Filled 2015-03-03: qty 10

## 2015-03-03 MED ORDER — CEFAZOLIN SODIUM-DEXTROSE 2-3 GM-% IV SOLR
2.0000 g | Freq: Three times a day (TID) | INTRAVENOUS | Status: AC
  Administered 2015-03-03: 2 g via INTRAVENOUS
  Filled 2015-03-03 (×2): qty 50

## 2015-03-03 MED ORDER — KCL IN DEXTROSE-NACL 20-5-0.45 MEQ/L-%-% IV SOLN
INTRAVENOUS | Status: DC
  Administered 2015-03-03: 14:00:00 via INTRAVENOUS
  Filled 2015-03-03: qty 1000

## 2015-03-03 MED ORDER — LACTATED RINGERS IV SOLN
INTRAVENOUS | Status: DC
  Administered 2015-03-03 (×2): via INTRAVENOUS

## 2015-03-03 MED ORDER — HYDROMORPHONE HCL 1 MG/ML IJ SOLN
INTRAMUSCULAR | Status: AC
  Filled 2015-03-03: qty 1

## 2015-03-03 MED ORDER — MEPERIDINE HCL 25 MG/ML IJ SOLN: 6.2500 mg | INTRAMUSCULAR | Status: DC | PRN

## 2015-03-03 MED ORDER — ONDANSETRON HCL 4 MG/2ML IJ SOLN
INTRAMUSCULAR | Status: AC
  Filled 2015-03-03: qty 2

## 2015-03-03 MED ORDER — SUCCINYLCHOLINE CHLORIDE 20 MG/ML IJ SOLN
INTRAMUSCULAR | Status: AC
  Filled 2015-03-03: qty 1

## 2015-03-03 MED ORDER — FENTANYL CITRATE (PF) 100 MCG/2ML IJ SOLN: 50.0000 ug | INTRAMUSCULAR | Status: DC | PRN

## 2015-03-03 MED ORDER — MIDAZOLAM HCL 2 MG/2ML IJ SOLN
INTRAMUSCULAR | Status: AC
  Filled 2015-03-03: qty 2

## 2015-03-03 MED ORDER — CEFAZOLIN SODIUM-DEXTROSE 2-3 GM-% IV SOLR
2.0000 g | INTRAVENOUS | Status: AC
  Administered 2015-03-03: 2 g via INTRAVENOUS

## 2015-03-03 MED ORDER — CEFAZOLIN SODIUM-DEXTROSE 2-3 GM-% IV SOLR
INTRAVENOUS | Status: AC
Start: 2015-03-03 — End: 2015-03-03
  Filled 2015-03-03: qty 50

## 2015-03-03 MED ORDER — HYDROMORPHONE HCL 1 MG/ML IJ SOLN
0.2500 mg | INTRAMUSCULAR | Status: DC | PRN
  Administered 2015-03-03 (×3): 0.5 mg via INTRAVENOUS

## 2015-03-03 MED ORDER — LIDOCAINE HCL (CARDIAC) 20 MG/ML IV SOLN
INTRAVENOUS | Status: DC | PRN
  Administered 2015-03-03: 50 mg via INTRAVENOUS

## 2015-03-03 MED ORDER — ESMOLOL HCL 100 MG/10ML IV SOLN
INTRAVENOUS | Status: AC
  Filled 2015-03-03: qty 10

## 2015-03-03 MED ORDER — VALSARTAN-HYDROCHLOROTHIAZIDE 160-25 MG PO TABS: 1.0000 | ORAL_TABLET | Freq: Every day | ORAL | Status: DC

## 2015-03-03 MED ORDER — EPHEDRINE SULFATE 50 MG/ML IJ SOLN
INTRAMUSCULAR | Status: AC
  Filled 2015-03-03: qty 1

## 2015-03-03 MED ORDER — GABAPENTIN 300 MG PO CAPS
300.0000 mg | ORAL_CAPSULE | Freq: Two times a day (BID) | ORAL | Status: DC
Start: 2015-03-03 — End: 2015-03-04

## 2015-03-03 MED ORDER — LIDOCAINE HCL (CARDIAC) 20 MG/ML IV SOLN
INTRAVENOUS | Status: AC
  Filled 2015-03-03: qty 5

## 2015-03-03 MED ORDER — TIZANIDINE HCL 4 MG PO TABS: 4.0000 mg | ORAL_TABLET | Freq: Every day | ORAL | Status: DC

## 2015-03-03 MED ORDER — HYDROMORPHONE HCL 1 MG/ML IJ SOLN: 0.5000 mg | INTRAMUSCULAR | Status: DC | PRN

## 2015-03-03 MED ORDER — PROMETHAZINE HCL 25 MG/ML IJ SOLN: 6.2500 mg | INTRAMUSCULAR | Status: DC | PRN

## 2015-03-03 MED ORDER — ONDANSETRON HCL 4 MG/2ML IJ SOLN
INTRAMUSCULAR | Status: DC | PRN
  Administered 2015-03-03: 4 mg via INTRAVENOUS

## 2015-03-03 MED ORDER — SUCCINYLCHOLINE CHLORIDE 20 MG/ML IJ SOLN
INTRAMUSCULAR | Status: DC | PRN
  Administered 2015-03-03: 120 mg via INTRAVENOUS

## 2015-03-03 MED ORDER — PROPOFOL 500 MG/50ML IV EMUL
INTRAVENOUS | Status: AC
  Filled 2015-03-03: qty 50

## 2015-03-03 MED ORDER — CEFAZOLIN SODIUM-DEXTROSE 2-3 GM-% IV SOLR
INTRAVENOUS | Status: AC
  Filled 2015-03-03: qty 50

## 2015-03-03 MED ORDER — PHENYLEPHRINE HCL 10 MG/ML IJ SOLN
10.0000 mg | INTRAVENOUS | Status: DC | PRN
  Administered 2015-03-03: 40 ug/min via INTRAVENOUS

## 2015-03-03 MED ORDER — AMLODIPINE BESYLATE 10 MG PO TABS: 10.0000 mg | ORAL_TABLET | Freq: Every day | ORAL | Status: DC

## 2015-03-03 MED ORDER — 0.9 % SODIUM CHLORIDE (POUR BTL) OPTIME
TOPICAL | Status: DC | PRN
  Administered 2015-03-03: 1000 mL

## 2015-03-03 MED ORDER — ATROPINE SULFATE 0.4 MG/ML IJ SOLN
INTRAMUSCULAR | Status: AC
  Filled 2015-03-03: qty 1

## 2015-03-03 MED ORDER — DEXAMETHASONE SODIUM PHOSPHATE 10 MG/ML IJ SOLN
INTRAMUSCULAR | Status: AC
  Filled 2015-03-03: qty 1

## 2015-03-03 MED ORDER — MIDAZOLAM HCL 2 MG/2ML IJ SOLN
1.0000 mg | INTRAMUSCULAR | Status: DC | PRN
  Administered 2015-03-03: 2 mg via INTRAVENOUS

## 2015-03-03 MED ORDER — GLYCOPYRROLATE 0.2 MG/ML IJ SOLN: 0.2000 mg | Freq: Once | INTRAMUSCULAR | Status: DC | PRN

## 2015-03-03 MED ORDER — DEXAMETHASONE SODIUM PHOSPHATE 4 MG/ML IJ SOLN
INTRAMUSCULAR | Status: DC | PRN
  Administered 2015-03-03: 10 mg via INTRAVENOUS

## 2015-03-03 MED ORDER — SUFENTANIL CITRATE 50 MCG/ML IV SOLN
INTRAVENOUS | Status: AC
  Filled 2015-03-03: qty 1

## 2015-03-03 MED ORDER — PROPOFOL 10 MG/ML IV BOLUS
INTRAVENOUS | Status: DC | PRN
  Administered 2015-03-03: 150 mg via INTRAVENOUS

## 2015-03-03 MED ORDER — CHLORHEXIDINE GLUCONATE 4 % EX LIQD: 1.0000 "application " | Freq: Once | CUTANEOUS | Status: DC

## 2015-03-03 MED ORDER — OXYCODONE-ACETAMINOPHEN 5-325 MG PO TABS
1.0000 | ORAL_TABLET | ORAL | Status: DC | PRN
  Administered 2015-03-03: 2 via ORAL
  Administered 2015-03-04: 1 via ORAL
  Filled 2015-03-03: qty 2
  Filled 2015-03-03: qty 1

## 2015-03-03 MED ORDER — SCOPOLAMINE 1 MG/3DAYS TD PT72: 1.0000 | MEDICATED_PATCH | Freq: Once | TRANSDERMAL | Status: DC | PRN

## 2015-03-03 MED ORDER — ONDANSETRON HCL 4 MG/2ML IJ SOLN: 4.0000 mg | Freq: Four times a day (QID) | INTRAMUSCULAR | Status: DC | PRN

## 2015-03-03 MED ORDER — MIDAZOLAM HCL 2 MG/2ML IJ SOLN: 0.5000 mg | Freq: Once | INTRAMUSCULAR | Status: DC | PRN

## 2015-03-03 SURGICAL SUPPLY — 48 items
APPLIER CLIP 9.375 MED OPEN (MISCELLANEOUS) ×2
BINDER BREAST LRG (GAUZE/BANDAGES/DRESSINGS) IMPLANT
BINDER BREAST MEDIUM (GAUZE/BANDAGES/DRESSINGS) IMPLANT
BINDER BREAST XLRG (GAUZE/BANDAGES/DRESSINGS) IMPLANT
BINDER BREAST XXLRG (GAUZE/BANDAGES/DRESSINGS) ×2 IMPLANT
BLADE SURG 10 STRL SS (BLADE) ×6 IMPLANT
BLADE SURG 15 STRL LF DISP TIS (BLADE) ×2 IMPLANT
BLADE SURG 15 STRL SS (BLADE) ×2
BNDG GAUZE ELAST 4 BULKY (GAUZE/BANDAGES/DRESSINGS) ×4 IMPLANT
CANISTER SUCT 1200ML W/VALVE (MISCELLANEOUS) ×2 IMPLANT
CHLORAPREP W/TINT 26ML (MISCELLANEOUS) ×2 IMPLANT
CLIP APPLIE 9.375 MED OPEN (MISCELLANEOUS) ×1 IMPLANT
COVER BACK TABLE 60X90IN (DRAPES) ×2 IMPLANT
COVER MAYO STAND STRL (DRAPES) ×2 IMPLANT
DRAIN CHANNEL 15F RND FF W/TCR (WOUND CARE) ×4 IMPLANT
DRAPE LAPAROSCOPIC ABDOMINAL (DRAPES) IMPLANT
DRSG PAD ABDOMINAL 8X10 ST (GAUZE/BANDAGES/DRESSINGS) ×4 IMPLANT
ELECT COATED BLADE 2.86 ST (ELECTRODE) ×2 IMPLANT
ELECT REM PT RETURN 9FT ADLT (ELECTROSURGICAL) ×2
ELECTRODE REM PT RTRN 9FT ADLT (ELECTROSURGICAL) ×1 IMPLANT
EVACUATOR SILICONE 100CC (DRAIN) ×4 IMPLANT
GLOVE BIO SURGEON STRL SZ 6 (GLOVE) ×4 IMPLANT
GLOVE BIO SURGEON STRL SZ 6.5 (GLOVE) ×2 IMPLANT
GLOVE BIOGEL PI IND STRL 7.0 (GLOVE) ×5 IMPLANT
GLOVE BIOGEL PI INDICATOR 7.0 (GLOVE) ×5
GLOVE ECLIPSE 6.5 STRL STRAW (GLOVE) ×4 IMPLANT
GOWN STRL REUS W/ TWL LRG LVL3 (GOWN DISPOSABLE) ×4 IMPLANT
GOWN STRL REUS W/TWL LRG LVL3 (GOWN DISPOSABLE) ×4
LIQUID BAND (GAUZE/BANDAGES/DRESSINGS) ×8 IMPLANT
NS IRRIG 1000ML POUR BTL (IV SOLUTION) ×4 IMPLANT
PACK BASIN DAY SURGERY FS (CUSTOM PROCEDURE TRAY) ×2 IMPLANT
PACK UNIVERSAL I (CUSTOM PROCEDURE TRAY) IMPLANT
PENCIL BUTTON HOLSTER BLD 10FT (ELECTRODE) ×2 IMPLANT
PIN SAFETY STERILE (MISCELLANEOUS) ×2 IMPLANT
SHEET MEDIUM DRAPE 40X70 STRL (DRAPES) ×4 IMPLANT
SLEEVE SCD COMPRESS KNEE MED (MISCELLANEOUS) ×2 IMPLANT
SPONGE LAP 18X18 X RAY DECT (DISPOSABLE) ×10 IMPLANT
STAPLER VISISTAT 35W (STAPLE) ×6 IMPLANT
SUT ETHILON 2 0 FS 18 (SUTURE) ×4 IMPLANT
SUT MNCRL AB 4-0 PS2 18 (SUTURE) ×8 IMPLANT
SUT VIC AB 3-0 PS1 18 (SUTURE) ×6
SUT VIC AB 3-0 PS1 18XBRD (SUTURE) ×6 IMPLANT
SUT VICRYL 4-0 PS2 18IN ABS (SUTURE) ×8 IMPLANT
SYR BULB IRRIGATION 50ML (SYRINGE) ×2 IMPLANT
TOWEL OR 17X24 6PK STRL BLUE (TOWEL DISPOSABLE) ×4 IMPLANT
TUBE CONNECTING 20X1/4 (TUBING) ×2 IMPLANT
UNDERPAD 30X30 (UNDERPADS AND DIAPERS) ×4 IMPLANT
YANKAUER SUCT BULB TIP NO VENT (SUCTIONS) ×2 IMPLANT

## 2015-03-03 NOTE — Interval H&P Note (Signed)
History and Physical Interval Note:  03/03/2015 6:53 AM  Brianna Jones  has presented today for surgery, with the diagnosis of RIGHT BREAST CANCER AND LEFT BREAST DCIS   The various methods of treatment have been discussed with the patient and family. After consideration of risks, benefits and other options for treatment, the patient has consented to  Procedure(s): BILATERAL ONCOPLASTIC RECONSTRUCTION BY BREAST REDUCTION  (Bilateral) as a surgical intervention .  The patient's history has been reviewed, patient examined, no change in status, stable for surgery.  I have reviewed the patient's chart and labs.  Questions were answered to the patient's satisfaction.     Magaly Pollina

## 2015-03-03 NOTE — Anesthesia Preprocedure Evaluation (Addendum)
Anesthesia Evaluation  Patient identified by MRN, date of birth, ID band Patient awake    Reviewed: Allergy & Precautions, NPO status , Patient's Chart, lab work & pertinent test results  History of Anesthesia Complications Negative for: history of anesthetic complications  Airway Mallampati: II  TM Distance: >3 FB Neck ROM: Full    Dental  (+) Dental Advisory Given   Pulmonary neg pulmonary ROS,    breath sounds clear to auscultation       Cardiovascular hypertension, Pt. on medications (-) angina Rhythm:Regular Rate:Normal     Neuro/Psych negative neurological ROS     GI/Hepatic negative GI ROS, Neg liver ROS,   Endo/Other  negative endocrine ROS  Renal/GU negative Renal ROS     Musculoskeletal  (+) Arthritis , Osteoarthritis,    Abdominal (+) + obese,   Peds  Hematology negative hematology ROS (+)   Anesthesia Other Findings Breast cancer  Reproductive/Obstetrics                            Anesthesia Physical Anesthesia Plan  ASA: III  Anesthesia Plan: General   Post-op Pain Management:    Induction: Intravenous  Airway Management Planned: Oral ETT  Additional Equipment:   Intra-op Plan:   Post-operative Plan: Extubation in OR  Informed Consent: I have reviewed the patients History and Physical, chart, labs and discussed the procedure including the risks, benefits and alternatives for the proposed anesthesia with the patient or authorized representative who has indicated his/her understanding and acceptance.   Dental advisory given  Plan Discussed with: CRNA and Surgeon  Anesthesia Plan Comments: (Plan routine monitors, GETA)        Anesthesia Quick Evaluation

## 2015-03-03 NOTE — Discharge Instructions (Signed)

## 2015-03-03 NOTE — Op Note (Signed)
Operative Note   DATE OF OPERATION: 2.3.17  LOCATION: [place] outpatient  SURGICAL DIVISION: Plastic Surgery  PREOPERATIVE DIAGNOSES:  1. Right breast invasive lobular cancer 12 o clock 2. Left breast DCIS 2 o clock 3. Post bilateral lumpectomy  POSTOPERATIVE DIAGNOSES:  same  PROCEDURE:  Bilateral oncoplastic reconstruction with breast reduction  SURGEON: Irene Limbo MD MBA  ASSISTANT: none  ANESTHESIA:  General.   EBL: 0000000 ml  COMPLICATIONS: None immediate.   INDICATIONS FOR PROCEDURE:  The patient, Brianna Jones, is a 65 y.o. female born on 1950/12/18, is here for breast reconstruction with breast reduction following bilateral lumpectomy 1 week ago. She will receive adjuvant radiation.   FINDINGS: Prior lumpectomies weighing approximately 85 g RIGHT breast, 140 g LEFT breast. Breast reduction specimens 807 g RIGHT, 637 g LEFT.  DESCRIPTION OF PROCEDURE:  The patient was marked standing in the preoperative area to mark sternal notch, chest midline, anterior axillary lines, inframammary folds. The location of new nipple areolar complex was marked at level of on iframammary fold on anterior surface breast by palpation. This was marked symmetric over bilateral breasts. With aid of Wise pattern marker, location of new nipple areolar complex and vertical limbs (8 cm) were marked. The patient was taken to the operating room. SCDs were placed and IV antibiotics were given. The patient's operative site was prepped and draped in a sterile fashion. A time out was performed and all information was confirmed to be correct.   Over left breast, superomedial pedicle marked and nipple areolar complex marked with 50 mm diameter marker. Pedicle deepithlialized and developed  5-6 cm thickness and dissected toward chest wall until tension free rotation of pedicle achieved. Prior lumpectomy incision was made adjacent to lateral vertical limb and this was excised with resection. Inferior  pole breast tissue resected. Lumpectomy cavity noted at 2 o clock.  Medial and lateral flaps developed. Breast tailor tacked closed.  I then directed attention to right breast where inferior pedicle designed given lumpectomy cavity at 12 o clock position. The pedicle was deepithelialized and developed to chest wall. Medial and lateral triangles of breast tissue excised. Additional tissue of superior pole excised including near entirety of lumpectomy cavity. The lumpectomy cavity was sent as separate specimen. Skin and soft tissue flaps developed until able to be redraped over pedicle without tension. Patient brought to upright sitting position and assessed for symmetry.  Patent returned to supine position and breast cavities irrigated and hemostasis obtained. 15 Fr JP placed in each breast and secured with 2-0 nylon. Closure completed with 3-0 vicryl to approximate dermis along inframammary fold. NAC inset and vertical limb closed with 4-0 vicryl in dermis.Skin closure completed with 4-0 monocryl subcuticular throughout.Tissue adhesive applied.  Dry dressing and breast binder applied. The patient was allowed to wake from anesthesia, extubated and taken to the recovery room in satisfactory condition.   SPECIMENS: 1. Left breast reduction 2. Right breast reduction 3. Right breast reduction, lumpectomy cavity  DRAINS: 15 Fr JP in right and left breast  Irene Limbo, MD [facility] Plastic & Reconstructive Surgery [phone]

## 2015-03-03 NOTE — Anesthesia Procedure Notes (Signed)
Procedure Name: Intubation Date/Time: 03/03/2015 7:27 AM Performed by: Melynda Ripple D Pre-anesthesia Checklist: Patient identified, Emergency Drugs available, Suction available and Patient being monitored Patient Re-evaluated:Patient Re-evaluated prior to inductionOxygen Delivery Method: Circle System Utilized Preoxygenation: Pre-oxygenation with 100% oxygen Intubation Type: IV induction Ventilation: Mask ventilation without difficulty Laryngoscope Size: Mac and 3 Grade View: Grade I Tube type: Oral Tube size: 7.0 mm Number of attempts: 1 Airway Equipment and Method: Stylet and Oral airway Placement Confirmation: ETT inserted through vocal cords under direct vision,  positive ETCO2 and breath sounds checked- equal and bilateral Secured at: 23 cm Tube secured with: Tape Dental Injury: Teeth and Oropharynx as per pre-operative assessment

## 2015-03-03 NOTE — Transfer of Care (Signed)
Immediate Anesthesia Transfer of Care Note  Patient: Brianna Jones  Procedure(s) Performed: Procedure(s): BILATERAL ONCOPLASTIC RECONSTRUCTION BY BREAST REDUCTION  (Bilateral)  Patient Location: PACU  Anesthesia Type:General  Level of Consciousness: awake, alert  and oriented  Airway & Oxygen Therapy: Patient Spontanous Breathing and Patient connected to face mask oxygen  Post-op Assessment: Report given to RN and Post -op Vital signs reviewed and stable  Post vital signs: Reviewed and stable  Last Vitals:  Filed Vitals:   03/03/15 0635  BP: 116/76  Pulse: 114  Temp: 36.9 C  Resp: 18    Complications: No apparent anesthesia complications

## 2015-03-04 DIAGNOSIS — M1990 Unspecified osteoarthritis, unspecified site: Secondary | ICD-10-CM | POA: Diagnosis not present

## 2015-03-04 DIAGNOSIS — C50211 Malignant neoplasm of upper-inner quadrant of right female breast: Secondary | ICD-10-CM | POA: Diagnosis not present

## 2015-03-04 DIAGNOSIS — I10 Essential (primary) hypertension: Secondary | ICD-10-CM | POA: Diagnosis not present

## 2015-03-04 DIAGNOSIS — Z170 Estrogen receptor positive status [ER+]: Secondary | ICD-10-CM | POA: Diagnosis not present

## 2015-03-04 DIAGNOSIS — D0512 Intraductal carcinoma in situ of left breast: Secondary | ICD-10-CM | POA: Diagnosis not present

## 2015-03-06 ENCOUNTER — Encounter (HOSPITAL_BASED_OUTPATIENT_CLINIC_OR_DEPARTMENT_OTHER): Payer: Self-pay | Admitting: Plastic Surgery

## 2015-03-13 DIAGNOSIS — M5442 Lumbago with sciatica, left side: Secondary | ICD-10-CM | POA: Diagnosis not present

## 2015-03-13 DIAGNOSIS — M5441 Lumbago with sciatica, right side: Secondary | ICD-10-CM | POA: Diagnosis not present

## 2015-03-13 DIAGNOSIS — G603 Idiopathic progressive neuropathy: Secondary | ICD-10-CM | POA: Diagnosis not present

## 2015-03-13 DIAGNOSIS — G5602 Carpal tunnel syndrome, left upper limb: Secondary | ICD-10-CM | POA: Diagnosis not present

## 2015-03-13 DIAGNOSIS — G5601 Carpal tunnel syndrome, right upper limb: Secondary | ICD-10-CM | POA: Diagnosis not present

## 2015-03-13 NOTE — Anesthesia Postprocedure Evaluation (Signed)
Anesthesia Post Note  Patient: Brianna Jones  Procedure(s) Performed: Procedure(s) (LRB): BILATERAL ONCOPLASTIC RECONSTRUCTION BY BREAST REDUCTION  (Bilateral)  Anesthesia Type: General Vital Signs Assessment: post-procedure vital signs reviewed and stable Cardiovascular status: blood pressure returned to baseline and stable Postop Assessment: no signs of nausea or vomiting Anesthetic complications: no    Last Vitals:  Filed Vitals:   03/04/15 0115 03/04/15 0530  BP: 111/68 107/69  Pulse: 98 95  Temp: 36.3 C 36.3 C  Resp: 16 16    Last Pain:  Filed Vitals:   03/06/15 0947  PainSc: Asleep                 Fabian Coca JENNETTE

## 2015-03-13 NOTE — Progress Notes (Signed)
Location of Breast Cancer:Breast cancer of upper-inner quadrant of right female breast and breast cancer of upper-outer quadrant of left female breast    Histology per Pathology Report:   03/03/15 Diagnosis 1. Breast, Mammoplasty, Left - FIBROCYSTIC CHANGE AND USUAL DUCTAL HYPERPLASIA WITH CALCIFICATIONS. - NO MALIGNANCY IDENTIFIED. 2. Breast, Mammoplasty, Right - FIBROCYSTIC CHANGE. - NO MALIGNANCY IDENTIFIED. 3. Breast, Mammoplasty, Right - RESECTION SITE CHANGES. - FIBROCYSTIC CHANGE. - NO MALIGNANCY IDENTIFIED  02/23/15 Diagnosis 1. Breast, lumpectomy, right - LOBULAR NEOPLASIA (ATYPICAL LOBULAR HYPERPLASIA AND LOBULAR CARCINOMA IN SITU) SEE COMMENT. - CALCIFICATIONS PRESENT. - PREVIOUS BIOPSY SITE. - SEE TUMOR SYNOPTIC TEMPLATE BELOW. 2. Lymph node, sentinel, biopsy, Right axillary - ONE LYMPH NODE, NEGATIVE FOR TUMOR (0/1), SEE COMMENT. 3. Breast, lumpectomy, Left - DUCTAL CARCINOMA IN SITU WITH CALCIFICATIONS, SEE COMMENT. - IN SITU CARCINOMA IS 4 MM FROM THE NEAREST MARGIN (ANTERIOR). - PREVIOUS BIOPSY SITE TISSUE CHANGE. - SEE TUMOR SYNOPTIC TEMPLATE BELOW.  01/06/15 Diagnosis 1. Breast, right, needle core biopsy, 12:30 o'clock - INVASIVE LOBULAR CARCINOMA. - LOBULAR CARCINOMA IN SITU WITH NECROSIS AND CALCIFICATIONS. - SEE COMMENT. 2. Breast, left, needle core biopsy, upper outer quadrant - DUCTAL CARCINOMA IN SITU WITH NECROSIS AND CALCIFICATIONS. - SEE COMMENT.  Receptor Status: ER(95%), PR (0%), Her2-neu (insuffcient) - right breast Receptor Status: ER(100%), PR (80%) - left breast  Did patient present with symptoms (if so, please note symptoms) or was this found on screening mammography?: screening mammogram  Past/Anticipated interventions by surgeon, if any: 02/23/15 -Procedure: RIGHT BREAST LUMPECTOMY WITH RADIOACTIVE SEED AND RIGHT AXILLARY SENTINEL LYMPH NODE BIOPSY;  Surgeon: Alphonsa Overall, MD;  Location: [place];  Service: General;  Laterality: Right;  Procedure: LEFT BREAST LUMPECTOMY WITH TWO NEEDLE LOCALIZATION BRACKET;  Surgeon: Alphonsa Overall, MD;  Location: [place];  Service: General;  Laterality: Left; Procedure: BILATERAL ONCOPLASTIC RECONSTRUCTION BY BREAST REDUCTION ;  Surgeon: Irene Limbo, MD;  Location: [place];  Service: Plastics;  Laterality: Bilateral;   Past/Anticipated interventions by medical oncology, if any: Dr. Lindi Adie is recommending "adjuvant radiation therapy followed by antiestrogen therapy with anastrozole 5 years."  Lymphedema issues, if any:  no  Pain issues, if any:  no   OB GYN history:  Patient was 65 years old with first menses, patient was in her 60's with birth of first children, she used bcp for 10 years.  SAFETY ISSUES:  Prior radiation? no  Pacemaker/ICD? no  Possible current pregnancy?no  Is the patient on methotrexate? no  Current Complaints / other details:  Patient is here with her sister.  BP 132/90 mmHg  Pulse 98  Temp(Src) 98.7 F (37.1 C) (Oral)  Resp 16  Ht _0  (1.575 m)  Wt 185 lb 8 oz (84.142 kg)  BMI 33.92 kg/m2

## 2015-03-15 ENCOUNTER — Encounter: Payer: Self-pay | Admitting: Radiation Oncology

## 2015-03-15 ENCOUNTER — Ambulatory Visit
Admission: RE | Admit: 2015-03-15 | Discharge: 2015-03-15 | Disposition: A | Discharge status: Home / Self Care | Payer: Medicare Other | Source: Ambulatory Visit | Attending: Radiation Oncology | Admitting: Radiation Oncology

## 2015-03-15 VITALS — BP 132/90 | HR 98 | Temp 98.7°F | Resp 16 | Ht 62.0 in | Wt 185.5 lb

## 2015-03-15 DIAGNOSIS — C50211 Malignant neoplasm of upper-inner quadrant of right female breast: Secondary | ICD-10-CM | POA: Diagnosis not present

## 2015-03-15 DIAGNOSIS — Z510 Encounter for antineoplastic radiation therapy: Secondary | ICD-10-CM | POA: Insufficient documentation

## 2015-03-15 DIAGNOSIS — Z170 Estrogen receptor positive status [ER+]: Secondary | ICD-10-CM | POA: Diagnosis not present

## 2015-03-15 DIAGNOSIS — C50412 Malignant neoplasm of upper-outer quadrant of left female breast: Secondary | ICD-10-CM | POA: Insufficient documentation

## 2015-03-15 NOTE — Progress Notes (Signed)
Radiation Oncology         [phone] ________________________________  Name: Brianna Jones MRN: [id number]  Date: 03/15/2015  DOB: [birth year]  Follow-Up Visit Note  CC: Maximino Greenland, MD  Alphonsa Overall, MD    ICD-9-CM ICD-10-CM   1. Breast cancer of upper-inner quadrant of right female breast ([place]) 174.2 C50.211   2. Breast cancer of upper-outer quadrant of left female breast ([place]) 174.4 C50.412     Diagnosis: Right Breast: stage T1a N0 Invasive Lobular  and LCIS (ER 95%, PR 0%) Left Breast: Tis N0 stage 0 DCIS with calcifications (ER 100%, PR 80%,)  Interval Since Last Radiation: N/A  Narrative:  The patient returns today for routine follow-up. She was previously seen by me for an initial consultation in the breast multidisciplinary clinic on 01/18/15. Since then, she had a bilateral breast MRI on 01/26/15. This revealed that the right breast malignancy at the 12:30 o'clock location anterior depth measures approximately 1.1 and there were no additional findings in the right breast to suggest multifocal or multicentric disease. The left breast malignancy measuring approximately 5 cm with clumped linear enhancement extending posterior superiorly from the biopsy marking clip in the upper-outer left breast. On 02/23/15, lumpectomy of the right breast revealed lobular neoplasia (atypical lobular hyperplasia and lobular carcinoma in situ) and calcifications were present. No additional invasive lobular carcinoma noted with the right lumpectomy specimen.  Lumpectomy of the left breast revealed grade 2 DCIS with calcifications. The tumor measured 4.0 cm. Distance to closest margin (anterior) is 4.0 mm. A right axillary lymph node that was biopsied was negative for malignancy. On 03/03/15, she underwent breast reduction surgery. Pathology obtained during surgery revealed no malignancy in either breast. The patient and her sister present to me today to discuss radiation for the management of her  disease.  She is not taking any pain medication and denies lymphedema in her upper extremities. She reports limited arm motion due to the breast incisions.  ALLERGIES:  has No Known Allergies.  Meds: Current Outpatient Prescriptions  Medication Sig Dispense Refill  . amLODipine (NORVASC) 10 MG tablet Take 10 mg by mouth daily.    Marland Kitchen gabapentin (NEURONTIN) 300 MG capsule Take 300-600 mg by mouth 2 (two) times daily. 1 capsule in the morning, 2 capsules at bedtime    . oxyCODONE-acetaminophen (PERCOCET/ROXICET) 5-325 MG tablet Take by mouth every 4 (four) hours as needed for severe pain.    . valsartan-hydrochlorothiazide (DIOVAN-HCT) 160-25 MG tablet Take 1 tablet by mouth daily.    . Cholecalciferol (VITAMIN D3) 5000 UNITS CAPS Take 1 capsule by mouth daily. Reported on 03/15/2015    . cyclobenzaprine (FLEXERIL) 10 MG tablet Take 10 mg by mouth 3 (three) times daily as needed. Reported on 03/15/2015  6  . HYDROcodone-acetaminophen (NORCO/VICODIN) 5-325 MG tablet Take 1 tablet by mouth every 8 (eight) hours as needed. Reported on 03/15/2015  0  . tiZANidine (ZANAFLEX) 4 MG tablet Take 4 mg by mouth at bedtime. Reported on 03/15/2015  6   No current facility-administered medications for this encounter.    Physical Findings: The patient is in no acute distress. Patient is alert and oriented.  height is _0  (1.575 m) and weight is 185 lb 8 oz (84.142 kg). Her oral temperature is 98.7 F (37.1 C). Her blood pressure is 132/90 and her pulse is 98. Her respiration is 16.    Lungs are clear to auscultation bilaterally. Heart has regular rate and rhythm. No palpable cervical, supraclavicular,  or axillary adenopathy. The right breast has a scar in the upper outer aspect from her sentinel node procedure. The patient has scars in the inferior breasts from her bilateral oncoplastic reconstruction with breast reduction. No visible lumpectomy scars. No signs of infection. Appear to healing appropriately. Her  arm ROM is somewhat limited right now due to her recent surgery.  Lab Findings: Lab Results  Component Value Date   WBC 7.7 02/15/2015   HGB 13.2 02/15/2015   HCT 40.0 02/15/2015   MCV 88.7 02/15/2015   PLT 185 02/15/2015    Radiographic Findings: Nm Sentinel Node Inj-no Rpt (breast)  02/23/2015  CLINICAL DATA: right breast cancer  (only doing sentinel lymph node on the right) Sulfur colloid was injected intradermally by the nuclear medicine technologist for breast cancer sentinel node localization.   Mm Breast Surgical Specimen  02/23/2015  CLINICAL DATA:  Patient with history of left breast DCIS for lumpectomy. EXAM: SPECIMEN RADIOGRAPH OF THE LEFT BREAST COMPARISON:  Previous exam(s). FINDINGS: Status post excision of the left breast. The wire tips and biopsy marker clip are present and are marked for pathology. IMPRESSION: Specimen radiograph of the left breast. Electronically Signed   By: Lovey Newcomer M.D.   On: 02/23/2015 12:20   Mm Breast Surgical Specimen  02/23/2015  CLINICAL DATA:  Recent diagnosis of right breast cancer. Radioactive seed localization was performed 02/21/2015. EXAM: SPECIMEN RADIOGRAPH OF THE RIGHT BREAST COMPARISON:  Previous exam(s). FINDINGS: Status post excision of the right breast. The radioactive seed and biopsy marker clip are present, completely intact, and were marked for pathology. The cancer is visible on the specimen radiograph. IMPRESSION: Specimen radiograph of the right breast. Electronically Signed   By: Curlene Dolphin M.D.   On: 02/23/2015 11:50   Mm Rt Radioactive Seed Loc Mammo Guide  02/21/2015  CLINICAL DATA:  New radioactive seed localization requested of the right breast for recent diagnosis of right breast cancer. EXAM: MAMMOGRAPHIC GUIDED RADIOACTIVE SEED LOCALIZATION OF THE RIGHT BREAST COMPARISON:  Previous exam(s). FINDINGS: Patient presents for radioactive seed localization prior to lumpectomy. I met with the patient and we discussed the  procedure of seed localization including benefits and alternatives. We discussed the high likelihood of a successful procedure. We discussed the risks of the procedure including infection, bleeding, tissue injury and further surgery. We discussed the low dose of radioactivity involved in the procedure. Informed, written consent was given. The usual time-out protocol was performed immediately prior to the procedure. Using mammographic guidance, sterile technique, 1% lidocaine and an I-125 radioactive seed, a mass with calcifications and associated ribbon shaped biopsy clip were localized using a superior to inferior approach. The follow-up mammogram images confirm the seed in the expected location and were marked for Dr. [name]. Follow-up survey of the patient confirms presence of the radioactive seed. Order number of I-125 seed:  [id number]. Total activity:  3.825 millicuries  Reference Date: 17 January 2015 The patient tolerated the procedure well and was released from the [facility]. She was given instructions regarding seed removal. IMPRESSION: Radioactive seed localization right breast. No apparent complications. Electronically Signed   By: Curlene Dolphin M.D.   On: 02/21/2015 15:14   Mm Lt Plc Breast Loc Dev   1st Lesion  Inc Mammo Guide  02/23/2015  CLINICAL DATA:  Patient with left breast DCIS. For preoperative localization prior to lumpectomy. EXAM: NEEDLE LOCALIZATION OF THE LEFT BREAST WITH MAMMO GUIDANCE COMPARISON:  Previous exams. FINDINGS: Patient presents for needle localization prior to  left breast lumpectomy. I met with the patient and we discussed the procedure of needle localization including benefits and alternatives. We discussed the high likelihood of a successful procedure. We discussed the risks of the procedure, including infection, bleeding, tissue injury, and further surgery. Informed, written consent was given. The usual time-out protocol was performed immediately prior to the  procedure. Using mammographic guidance, sterile technique, 1% lidocaine and a 7 cm modified Kopans needle, the anterior aspect of the left breast calcifications were localized using cranial approach. Using mammographic guidance, sterile technique, 1% lidocaine and a 7 cm modified Kopans needle, the posterior aspect of the left breast calcifications were localized using cranial approach. The images were marked for Dr. [name]. IMPRESSION: Needle localization left breast. No apparent complications. Electronically Signed   By: Lovey Newcomer M.D.   On: 02/23/2015 08:31   Mm Lt Plc Breast Loc Dev   Ea Add Lesion  Inc Mammo Guide  02/23/2015  CLINICAL DATA:  Patient with left breast DCIS. For preoperative localization prior to lumpectomy. EXAM: NEEDLE LOCALIZATION OF THE LEFT BREAST WITH MAMMO GUIDANCE COMPARISON:  Previous exams. FINDINGS: Patient presents for needle localization prior to left breast lumpectomy. I met with the patient and we discussed the procedure of needle localization including benefits and alternatives. We discussed the high likelihood of a successful procedure. We discussed the risks of the procedure, including infection, bleeding, tissue injury, and further surgery. Informed, written consent was given. The usual time-out protocol was performed immediately prior to the procedure. Using mammographic guidance, sterile technique, 1% lidocaine and a 7 cm modified Kopans needle, the anterior aspect of the left breast calcifications were localized using cranial approach. Using mammographic guidance, sterile technique, 1% lidocaine and a 7 cm modified Kopans needle, the posterior aspect of the left breast calcifications were localized using cranial approach. The images were marked for Dr. [name]. IMPRESSION: Needle localization left breast. No apparent complications. Electronically Signed   By: Lovey Newcomer M.D.   On: 02/23/2015 08:31   Impression: Breast cancer of upper-inner quadrant of right female  breast [facility]) RIGHT breast biopsy: 12:30: Invasive lobular carcinoma grade 1, ER 95%, PR 0% insufficient for her to our Ki-67; LCIS with necrosis and calcifications Right lumpectomy 02/23/2015: ALH, LCIS, 0/1 lymph node Pathological staging: T1a N0 stage IA  Breast cancer of upper-outer quadrant of left female breast ([place]) LEFT breast biopsy upper outer quadrant: DCIS with necrosis and calcifications, ER 100%, PR 80% Left lumpectomy: DCIS with calcifications 4.0 cm, 4 mm margin Pathological stage: Tis N0 stage 0  The patient would be a good candidate for breast conservation therapy with radiation to both breasts.  Plan: I spoke to the patient today regarding her diagnosis and options for treatment. We discussed the equivalence in terms of survival and local failure between mastectomy and breast conservation. We discussed the role of radiation in decreasing local failures in patients who undergo lumpectomy. We discussed the process of CT simulation and the placement tattoos. We discussed ~5-6 weeks of treatment as an outpatient. We discussed the possibility of asymptomatic lung damage. We discussed the low likelihood of secondary malignancies. We discussed the possible side effects including but not limited to skin redness, fatigue, permanent skin darkening, and breast swelling. CT simulation has been scheduled on 04/06/15. Treatments to begin approximately 6 weeks postop. ____________________________________  Blair Promise, PhD, MD  This document serves as a record of services personally performed by Gery Pray, MD. It was created on his behalf by [facility]  Humphrey Rolls, a trained medical scribe. The creation of this record is based on the scribe's personal observations and the provider's statements to them. This document has been checked and approved by the attending provider.

## 2015-04-06 ENCOUNTER — Ambulatory Visit
Admission: RE | Admit: 2015-04-06 | Discharge: 2015-04-06 | Disposition: A | Discharge status: Home / Self Care | Payer: Medicare Other | Source: Ambulatory Visit | Attending: Radiation Oncology | Admitting: Radiation Oncology

## 2015-04-06 DIAGNOSIS — M5432 Sciatica, left side: Secondary | ICD-10-CM | POA: Diagnosis not present

## 2015-04-06 DIAGNOSIS — C50412 Malignant neoplasm of upper-outer quadrant of left female breast: Secondary | ICD-10-CM

## 2015-04-06 DIAGNOSIS — Z510 Encounter for antineoplastic radiation therapy: Secondary | ICD-10-CM | POA: Diagnosis not present

## 2015-04-06 DIAGNOSIS — C50211 Malignant neoplasm of upper-inner quadrant of right female breast: Secondary | ICD-10-CM

## 2015-04-06 DIAGNOSIS — M25572 Pain in left ankle and joints of left foot: Secondary | ICD-10-CM | POA: Diagnosis not present

## 2015-04-06 DIAGNOSIS — Z170 Estrogen receptor positive status [ER+]: Secondary | ICD-10-CM | POA: Diagnosis not present

## 2015-04-06 DIAGNOSIS — G629 Polyneuropathy, unspecified: Secondary | ICD-10-CM | POA: Diagnosis not present

## 2015-04-06 NOTE — Progress Notes (Signed)
  Radiation Oncology         [phone] ________________________________  Name: Brianna Jones MRN: [id number]  Date: 04/06/2015  DOB: 1950/04/16  SIMULATION AND TREATMENT PLANNING NOTE   DIAGNOSIS:  Right Breast: stage T1a N0 Invasive Lobular  and LCIS (ER 95%, PR 0%) Left Breast: Tis N0 stage 0 DCIS with calcifications (ER 100%, PR 80%,)  NARRATIVE:  The patient was brought to the [place].  Identity was confirmed.  All relevant records and images related to the planned course of therapy were reviewed.  The patient freely provided informed written consent to proceed with treatment after reviewing the details related to the planned course of therapy. The consent form was witnessed and verified by the simulation staff.  Then, the patient was set-up in a stable reproducible  supine position for radiation therapy.  CT images were obtained.  Surface markings were placed.  The CT images were loaded into the planning software.  Then the target and avoidance structures were contoured.  Treatment planning then occurred.  The radiation prescription was entered and confirmed.  Then, I designed and supervised the construction of a total of 5 medically necessary complex treatment devices.  I have requested : 3D Simulation  I have requested a DVH of the following structures: heart, lungs, breasts, esophogus.  I have ordered:dose calc.  PLAN:  The patient will receive 50.4 Gy in 28 fractions directed at both breast areas. Given the clear margins and lack of identifiable lumpectomy cavities (given the fact that the patient underwent reduction mammoplasties),  no boost will be given.  -----------------------------------  Blair Promise, PhD, MD  This document serves as a record of services personally performed by Gery Pray, MD. It was created on his behalf by Derek Mound, a trained medical scribe. The creation of this record is based on the scribe's personal observations and the  provider's statements to them. This document has been checked and approved by the attending provider.

## 2015-04-12 DIAGNOSIS — Z510 Encounter for antineoplastic radiation therapy: Secondary | ICD-10-CM | POA: Diagnosis not present

## 2015-04-12 DIAGNOSIS — C50911 Malignant neoplasm of unspecified site of right female breast: Secondary | ICD-10-CM | POA: Diagnosis not present

## 2015-04-12 DIAGNOSIS — Z170 Estrogen receptor positive status [ER+]: Secondary | ICD-10-CM | POA: Diagnosis not present

## 2015-04-12 DIAGNOSIS — C50211 Malignant neoplasm of upper-inner quadrant of right female breast: Secondary | ICD-10-CM | POA: Diagnosis not present

## 2015-04-12 DIAGNOSIS — C50412 Malignant neoplasm of upper-outer quadrant of left female breast: Secondary | ICD-10-CM | POA: Diagnosis not present

## 2015-04-13 DIAGNOSIS — Z510 Encounter for antineoplastic radiation therapy: Secondary | ICD-10-CM | POA: Diagnosis not present

## 2015-04-13 DIAGNOSIS — Z170 Estrogen receptor positive status [ER+]: Secondary | ICD-10-CM | POA: Diagnosis not present

## 2015-04-13 DIAGNOSIS — C50211 Malignant neoplasm of upper-inner quadrant of right female breast: Secondary | ICD-10-CM | POA: Diagnosis not present

## 2015-04-13 DIAGNOSIS — C50412 Malignant neoplasm of upper-outer quadrant of left female breast: Secondary | ICD-10-CM | POA: Diagnosis not present

## 2015-04-14 ENCOUNTER — Ambulatory Visit
Admission: RE | Admit: 2015-04-14 | Discharge: 2015-04-14 | Disposition: A | Discharge status: Home / Self Care | Payer: Medicare Other | Source: Ambulatory Visit | Attending: Radiation Oncology | Admitting: Radiation Oncology

## 2015-04-14 DIAGNOSIS — Z170 Estrogen receptor positive status [ER+]: Secondary | ICD-10-CM | POA: Diagnosis not present

## 2015-04-14 DIAGNOSIS — C50412 Malignant neoplasm of upper-outer quadrant of left female breast: Secondary | ICD-10-CM | POA: Diagnosis not present

## 2015-04-14 DIAGNOSIS — C50211 Malignant neoplasm of upper-inner quadrant of right female breast: Secondary | ICD-10-CM | POA: Diagnosis not present

## 2015-04-14 DIAGNOSIS — Z510 Encounter for antineoplastic radiation therapy: Secondary | ICD-10-CM | POA: Diagnosis not present

## 2015-04-17 ENCOUNTER — Ambulatory Visit
Admission: RE | Admit: 2015-04-17 | Discharge: 2015-04-17 | Disposition: A | Discharge status: Home / Self Care | Payer: Medicare Other | Source: Ambulatory Visit | Attending: Radiation Oncology | Admitting: Radiation Oncology

## 2015-04-17 DIAGNOSIS — Z170 Estrogen receptor positive status [ER+]: Secondary | ICD-10-CM | POA: Diagnosis not present

## 2015-04-17 DIAGNOSIS — C50211 Malignant neoplasm of upper-inner quadrant of right female breast: Secondary | ICD-10-CM | POA: Diagnosis not present

## 2015-04-17 DIAGNOSIS — C50412 Malignant neoplasm of upper-outer quadrant of left female breast: Secondary | ICD-10-CM | POA: Diagnosis not present

## 2015-04-17 DIAGNOSIS — Z510 Encounter for antineoplastic radiation therapy: Secondary | ICD-10-CM | POA: Diagnosis not present

## 2015-04-18 ENCOUNTER — Encounter: Payer: Self-pay | Admitting: Radiation Oncology

## 2015-04-18 ENCOUNTER — Ambulatory Visit
Admission: RE | Admit: 2015-04-18 | Discharge: 2015-04-18 | Disposition: A | Discharge status: Home / Self Care | Payer: Medicare Other | Source: Ambulatory Visit | Attending: Radiation Oncology | Admitting: Radiation Oncology

## 2015-04-18 VITALS — BP 129/81 | HR 90 | Temp 98.1°F | Resp 18 | Ht 62.0 in | Wt 194.2 lb

## 2015-04-18 DIAGNOSIS — C50412 Malignant neoplasm of upper-outer quadrant of left female breast: Secondary | ICD-10-CM

## 2015-04-18 DIAGNOSIS — C50211 Malignant neoplasm of upper-inner quadrant of right female breast: Secondary | ICD-10-CM

## 2015-04-18 DIAGNOSIS — Z510 Encounter for antineoplastic radiation therapy: Secondary | ICD-10-CM | POA: Diagnosis not present

## 2015-04-18 DIAGNOSIS — Z170 Estrogen receptor positive status [ER+]: Secondary | ICD-10-CM | POA: Diagnosis not present

## 2015-04-18 MED ORDER — RADIAPLEXRX EX GEL
Freq: Once | CUTANEOUS | Status: AC
  Administered 2015-04-18: 17:00:00 via TOPICAL

## 2015-04-18 MED ORDER — ALRA NON-METALLIC DEODORANT (RAD-ONC)
1.0000 "application " | Freq: Once | TOPICAL | Status: AC
  Administered 2015-04-18: 1 via TOPICAL

## 2015-04-18 NOTE — Progress Notes (Signed)
Brianna Jones has completed 2 fractions to Brianna Jones right and left breast.  She reports having pain due to a pinched nerve in Brianna Jones back that she had since December.  She takes percocet as needed.  The skin on both breasts is red around Brianna Jones nipple area and under Brianna Jones breasts.  She said the redness showed up this morning.    BP 129/81 mmHg  Pulse 90  Temp(Src) 98.1 F (36.7 C) (Oral)  Resp 18  Ht 5\' 2"  (1.575 m)  Wt 194 lb 3.2 oz (88.089 kg)  BMI 35.51 kg/m2

## 2015-04-18 NOTE — Progress Notes (Signed)
Pt here for patient teaching.  Pt given Radiation and You booklet, skin care instructions, Alra deodorant and Radiaplex gel. Pt reports they have watched the Radiation Therapy Education video.  Reviewed areas of pertinence such as fatigue, skin changes, breast tenderness and breast swelling . Pt able to give teach back of to pat skin and use unscented/gentle soap,apply Radiaplex bid and avoid applying anything to skin within 4 hours of treatment. Pt demonstrated understanding and verbalizes understanding of information given and will contact nursing with any questions or concerns.     [email or web address]      

## 2015-04-18 NOTE — Progress Notes (Signed)
  Radiation Oncology         [phone]) [phone] ________________________________  Name: Brianna Jones MRN: [id number]  Date: 04/18/2015  DOB: 12/24/1950  Weekly Radiation Therapy Management    ICD-9-CM ICD-10-CM   1. Breast cancer of upper-inner quadrant of right female breast ([place]) 174.2 C50.211   2. Breast cancer of upper-outer quadrant of left female breast ([place]) 174.4 C50.412    DIAGNOSIS: Right Breast: stage T1a N0 Invasive Lobular and LCIS (ER 95%, PR 0%) Left Breast: Tis N0 stage 0 DCIS with calcifications (ER 100%, PR 80%,)  Current Dose: 3.6 Gy     Planned Dose:  50.4 Gy  Narrative . . . . . . . . The patient presents for routine under treatment assessment.                                   [name] has completed 2 fractions to her right and left breast. She reports having pain due to a pinched nerve in her back that she had since December. She takes percocet as needed. The skin on both breasts is red around her nipple area and under her breasts. She said the redness showed up this morning. 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                                 Set-up films were reviewed.                                 The chart was checked. Physical Findings. . .  height is 5\' 2"  (1.575 m) and weight is 194 lb 3.2 oz (88.089 kg). Her oral temperature is 98.1 F (36.7 C). Her blood pressure is 129/81 and her pulse is 90. Her respiration is 18. . Weight essentially stable.  No significant changes. The lungs are clear. The heart has a regular rhythm and rate. The left breast shows some mild erythema in the inferior aspect. The right breast also shows erythema more significant in the inferior aspect of the breast.. There is slight warmth to the breasts. Impression . . . . . . . The patient is tolerating radiation. Plan . . . . . . . . . . . . Continue treatment as planned.  Patient will be reexamined in a couple of days in light of her  erythema. It is too early to see a significant radiation reaction. She may possibly be developing infections in her surgical beds.  ________________________________   Blair Promise, PhD, MD

## 2015-04-19 ENCOUNTER — Ambulatory Visit
Admission: RE | Admit: 2015-04-19 | Discharge: 2015-04-19 | Disposition: A | Discharge status: Home / Self Care | Payer: Medicare Other | Source: Ambulatory Visit | Attending: Radiation Oncology | Admitting: Radiation Oncology

## 2015-04-19 DIAGNOSIS — Z510 Encounter for antineoplastic radiation therapy: Secondary | ICD-10-CM | POA: Diagnosis not present

## 2015-04-19 DIAGNOSIS — C50211 Malignant neoplasm of upper-inner quadrant of right female breast: Secondary | ICD-10-CM | POA: Diagnosis not present

## 2015-04-19 DIAGNOSIS — M25572 Pain in left ankle and joints of left foot: Secondary | ICD-10-CM | POA: Diagnosis not present

## 2015-04-19 DIAGNOSIS — Z170 Estrogen receptor positive status [ER+]: Secondary | ICD-10-CM | POA: Diagnosis not present

## 2015-04-19 DIAGNOSIS — C50412 Malignant neoplasm of upper-outer quadrant of left female breast: Secondary | ICD-10-CM | POA: Diagnosis not present

## 2015-04-19 DIAGNOSIS — M7752 Other enthesopathy of left foot: Secondary | ICD-10-CM | POA: Diagnosis not present

## 2015-04-20 ENCOUNTER — Ambulatory Visit
Admission: RE | Admit: 2015-04-20 | Discharge: 2015-04-20 | Disposition: A | Discharge status: Home / Self Care | Payer: Medicare Other | Source: Ambulatory Visit | Attending: Radiation Oncology | Admitting: Radiation Oncology

## 2015-04-20 ENCOUNTER — Encounter: Payer: Self-pay | Admitting: Radiation Oncology

## 2015-04-20 ENCOUNTER — Telehealth: Payer: Self-pay | Admitting: Oncology

## 2015-04-20 VITALS — BP 137/78 | HR 84 | Temp 98.7°F

## 2015-04-20 DIAGNOSIS — C50412 Malignant neoplasm of upper-outer quadrant of left female breast: Secondary | ICD-10-CM

## 2015-04-20 DIAGNOSIS — Z170 Estrogen receptor positive status [ER+]: Secondary | ICD-10-CM | POA: Diagnosis not present

## 2015-04-20 DIAGNOSIS — C50211 Malignant neoplasm of upper-inner quadrant of right female breast: Secondary | ICD-10-CM

## 2015-04-20 DIAGNOSIS — Z510 Encounter for antineoplastic radiation therapy: Secondary | ICD-10-CM | POA: Diagnosis not present

## 2015-04-20 NOTE — Progress Notes (Signed)
  Radiation Oncology         [phone] ________________________________  Name: Brianna Jones MRN: [id number]  Date: 04/20/2015  DOB: [birth year]  Weekly Radiation Therapy Management  Current Dose: 7.2 Gy     Planned Dose:  50.4 Gy  DIAGNOSIS: Right Breast: stage T1a N0 Invasive Lobular and LCIS (ER 95%, PR 0%) Left Breast: Tis N0 stage 0 DCIS with calcifications (ER 100%, PR 80%,)  Narrative . . . . . . . . The patient presents for routine under treatment assessment. She was seen earlier this week and was noted to have erythema along her reconstruction scars anf the inferior aspect of the breast. She had only completed 2 treatments to the breast and erythema was not expected this early during the course of her treatment.                                                                Brianna Jones has completed 4 fractions to her right and left breasts today. She reports feeling heat in both of her breasts since the second fraction.  She is using radiaplex. Denies fevers or chills.  Physical Findings. . .  oral temperature is 98.7 F (37.1 C). Her blood pressure is 137/78 and her pulse is 84. . The skin on both of her breasts is red around the nipple areas and underneath her breast.  Impression . . . . . . . The patient is tolerating radiation. Plan . . . . . . . . . . . . In light of the erythema within the breast area at early course of radiation patient will be seeing her plastic surgeon tomorrow for further evaluation. Her radiation therapy be held tomorrow pending this evaluation.  ________________________________   Blair Promise, PhD, MD  This document serves as a record of services personally performed by Gery Pray, MD. It was created on his behalf by Derek Mound, a trained medical scribe. The creation of this record is based on the scribe's personal observations and the provider's statements to them. This document has been checked and approved by the attending provider.

## 2015-04-20 NOTE — Telephone Encounter (Signed)
Brianna Jones called back and said Brianna Jones has been scheduled for a 3 pm appointment tomorrow.  Called Brianna Jones and let her know that her radiation treatment has been canceled for tomorrow and that she has a 3 pm appointment at Dr. Para Skeans office.  Brianna Jones verbalized agreement and understanding.  She said the 3 pm appointment will work for her.  Notified Brianna Jones, RT on [place] [address] about treatment cancellation for tomorrow.

## 2015-04-20 NOTE — Progress Notes (Signed)
Brianna Jones has completed 4 fractions to her right and left breasts.  She reports feeling heat in both of her breasts since the second fraction.  The skin on both of her breasts is red around the nipple areas and underneath her breast.  She is using radiaplex.  BP 137/78 mmHg  Pulse 84  Temp(Src) 98.7 F (37.1 C) (Oral)

## 2015-04-20 NOTE — Telephone Encounter (Signed)
Called Dr. Para Skeans office and spoke to her nurse, Brianna Jones.  Explained that Brianna Jones's breasts are red after only having 4 days of radiation.  Asked if she could be seen tomorrow to rule out infection.  Brianna Jones said she would check and call back.

## 2015-04-21 ENCOUNTER — Ambulatory Visit: Payer: Medicare Other

## 2015-04-24 ENCOUNTER — Telehealth: Payer: Self-pay | Admitting: Oncology

## 2015-04-24 ENCOUNTER — Ambulatory Visit
Admission: RE | Admit: 2015-04-24 | Discharge: 2015-04-24 | Disposition: A | Discharge status: Home / Self Care | Payer: Medicare Other | Source: Ambulatory Visit | Attending: Radiation Oncology | Admitting: Radiation Oncology

## 2015-04-24 DIAGNOSIS — Z170 Estrogen receptor positive status [ER+]: Secondary | ICD-10-CM | POA: Diagnosis not present

## 2015-04-24 DIAGNOSIS — Z510 Encounter for antineoplastic radiation therapy: Secondary | ICD-10-CM | POA: Diagnosis not present

## 2015-04-24 DIAGNOSIS — C50412 Malignant neoplasm of upper-outer quadrant of left female breast: Secondary | ICD-10-CM | POA: Diagnosis not present

## 2015-04-24 DIAGNOSIS — C50211 Malignant neoplasm of upper-inner quadrant of right female breast: Secondary | ICD-10-CM | POA: Diagnosis not present

## 2015-04-24 NOTE — Telephone Encounter (Signed)
Left a message for Brianna Jones advising her that she can have radiation today.

## 2015-04-25 ENCOUNTER — Ambulatory Visit
Admission: RE | Admit: 2015-04-25 | Discharge: 2015-04-25 | Disposition: A | Discharge status: Home / Self Care | Payer: Medicare Other | Source: Ambulatory Visit | Attending: Radiation Oncology | Admitting: Radiation Oncology

## 2015-04-25 ENCOUNTER — Encounter: Payer: Self-pay | Admitting: Radiation Oncology

## 2015-04-25 ENCOUNTER — Telehealth: Payer: Self-pay | Admitting: *Deleted

## 2015-04-25 VITALS — BP 132/83 | HR 97 | Temp 98.6°F | Ht 62.0 in | Wt 196.4 lb

## 2015-04-25 DIAGNOSIS — C50211 Malignant neoplasm of upper-inner quadrant of right female breast: Secondary | ICD-10-CM

## 2015-04-25 DIAGNOSIS — Z170 Estrogen receptor positive status [ER+]: Secondary | ICD-10-CM | POA: Diagnosis not present

## 2015-04-25 DIAGNOSIS — Z510 Encounter for antineoplastic radiation therapy: Secondary | ICD-10-CM | POA: Diagnosis not present

## 2015-04-25 DIAGNOSIS — C50412 Malignant neoplasm of upper-outer quadrant of left female breast: Secondary | ICD-10-CM | POA: Diagnosis not present

## 2015-04-25 NOTE — Progress Notes (Signed)
Brianna Jones is here for her 6th fraction of radiation to her Right and Left Breasts. She admits to fatigue. Her breasts are red, and slightly warm to touch. She saw her plastic surgeon Friday 04/21/15 who felt like there was not an infection, but prescribed antibiotics to cover all the bases. Brianna Jones reports that she feels like the redness has not improved since starting the antibiotics. She is using the radiaplex cream twice daily.   BP 132/83 mmHg  Pulse 97  Temp(Src) 98.6 F (37 C)  Ht 5\' 2"  (1.575 m)  Wt 196 lb 6.4 oz (89.086 kg)  BMI 35.91 kg/m2   Wt Readings from Last 3 Encounters:  04/25/15 196 lb 6.4 oz (89.086 kg)  04/18/15 194 lb 3.2 oz (88.089 kg)  03/15/15 185 lb 8 oz (84.142 kg)

## 2015-04-25 NOTE — Telephone Encounter (Signed)
Spoke with patient to follow up after start of radiation.   She states her breast is really red and hot already.  She did see her surgeon today and she has started antibiotics.   Encouraged her to call with any needs or concerns.

## 2015-04-25 NOTE — Progress Notes (Signed)
  Radiation Oncology         [phone] ________________________________  Name: Brianna Jones MRN: [id number]  Date: 04/25/2015  DOB: Jan 01, 1951  Weekly Radiation Therapy Management    ICD-9-CM ICD-10-CM   1. Breast cancer of upper-inner quadrant of right female breast ([place]) 174.2 C50.211   2. Breast cancer of upper-outer quadrant of left female breast ([facility]) 174.4 C50.412      Current Dose: 10.8 Gy     Planned Dose:  50.4 Gy (both breasts)  Narrative . . . . . . . . The patient presents for routine under treatment assessment.                              The patient did see her plastic surgeon late last week and was placed on antibiotics. The patient is tolerating the antibiotics well. Patient notices a little more erythema after her radiation treatments. She has not noticed any change in her skin with the antibiotics overall however.                                 Set-up films were reviewed.                                 The chart was checked. Physical Findings. . .  height is 5\' 2"  (1.575 m) and weight is 196 lb 6.4 oz (89.086 kg). Her temperature is 98.6 F (37 C). Her blood pressure is 132/83 and her pulse is 97. . Weight essentially stable.  No significant changes. The lungs are clear. The heart has a regular rhythm and rate. Examination of the right breast reveals some erythema more so along the inferior aspect of the breast. No scar breakdown. The left breast area also shows erythema in the lower aspect of the breast no breakdown. Amount of erythema similar to last week. Impression . . . . . . . The patient is tolerating radiation. Plan . . . . . . . . . . . . Continue treatment as planned. I recommend the patient completed her planned course of antibiotic therapy just in case this is a early infection.  ________________________________   Blair Promise, PhD, MD

## 2015-04-26 ENCOUNTER — Ambulatory Visit
Admission: RE | Admit: 2015-04-26 | Discharge: 2015-04-26 | Disposition: A | Discharge status: Home / Self Care | Payer: Medicare Other | Source: Ambulatory Visit | Attending: Radiation Oncology | Admitting: Radiation Oncology

## 2015-04-26 DIAGNOSIS — Z510 Encounter for antineoplastic radiation therapy: Secondary | ICD-10-CM | POA: Diagnosis not present

## 2015-04-26 DIAGNOSIS — C50211 Malignant neoplasm of upper-inner quadrant of right female breast: Secondary | ICD-10-CM | POA: Diagnosis not present

## 2015-04-26 DIAGNOSIS — Z170 Estrogen receptor positive status [ER+]: Secondary | ICD-10-CM | POA: Diagnosis not present

## 2015-04-26 DIAGNOSIS — C50412 Malignant neoplasm of upper-outer quadrant of left female breast: Secondary | ICD-10-CM | POA: Diagnosis not present

## 2015-04-27 ENCOUNTER — Ambulatory Visit
Admission: RE | Admit: 2015-04-27 | Discharge: 2015-04-27 | Disposition: A | Discharge status: Home / Self Care | Payer: Medicare Other | Source: Ambulatory Visit | Attending: Radiation Oncology | Admitting: Radiation Oncology

## 2015-04-27 DIAGNOSIS — C50211 Malignant neoplasm of upper-inner quadrant of right female breast: Secondary | ICD-10-CM | POA: Diagnosis not present

## 2015-04-27 DIAGNOSIS — C50412 Malignant neoplasm of upper-outer quadrant of left female breast: Secondary | ICD-10-CM | POA: Diagnosis not present

## 2015-04-27 DIAGNOSIS — Z510 Encounter for antineoplastic radiation therapy: Secondary | ICD-10-CM | POA: Diagnosis not present

## 2015-04-27 DIAGNOSIS — Z170 Estrogen receptor positive status [ER+]: Secondary | ICD-10-CM | POA: Diagnosis not present

## 2015-04-28 ENCOUNTER — Ambulatory Visit
Admission: RE | Admit: 2015-04-28 | Discharge: 2015-04-28 | Disposition: A | Discharge status: Home / Self Care | Payer: Medicare Other | Source: Ambulatory Visit | Attending: Radiation Oncology | Admitting: Radiation Oncology

## 2015-04-28 DIAGNOSIS — Z510 Encounter for antineoplastic radiation therapy: Secondary | ICD-10-CM | POA: Diagnosis not present

## 2015-04-28 DIAGNOSIS — C50412 Malignant neoplasm of upper-outer quadrant of left female breast: Secondary | ICD-10-CM | POA: Diagnosis not present

## 2015-04-28 DIAGNOSIS — Z170 Estrogen receptor positive status [ER+]: Secondary | ICD-10-CM | POA: Diagnosis not present

## 2015-04-28 DIAGNOSIS — C50211 Malignant neoplasm of upper-inner quadrant of right female breast: Secondary | ICD-10-CM | POA: Diagnosis not present

## 2015-05-01 ENCOUNTER — Ambulatory Visit
Admission: RE | Admit: 2015-05-01 | Discharge: 2015-05-01 | Disposition: A | Discharge status: Home / Self Care | Payer: Medicare Other | Source: Ambulatory Visit | Attending: Radiation Oncology | Admitting: Radiation Oncology

## 2015-05-01 DIAGNOSIS — C50412 Malignant neoplasm of upper-outer quadrant of left female breast: Secondary | ICD-10-CM | POA: Diagnosis not present

## 2015-05-01 DIAGNOSIS — C50911 Malignant neoplasm of unspecified site of right female breast: Secondary | ICD-10-CM | POA: Diagnosis not present

## 2015-05-01 DIAGNOSIS — M5416 Radiculopathy, lumbar region: Secondary | ICD-10-CM | POA: Diagnosis not present

## 2015-05-01 DIAGNOSIS — C50211 Malignant neoplasm of upper-inner quadrant of right female breast: Secondary | ICD-10-CM | POA: Diagnosis not present

## 2015-05-01 DIAGNOSIS — Z170 Estrogen receptor positive status [ER+]: Secondary | ICD-10-CM | POA: Diagnosis not present

## 2015-05-01 DIAGNOSIS — Z510 Encounter for antineoplastic radiation therapy: Secondary | ICD-10-CM | POA: Diagnosis not present

## 2015-05-02 ENCOUNTER — Ambulatory Visit
Admission: RE | Admit: 2015-05-02 | Discharge: 2015-05-02 | Disposition: A | Discharge status: Home / Self Care | Payer: Medicare Other | Source: Ambulatory Visit | Attending: Radiation Oncology | Admitting: Radiation Oncology

## 2015-05-02 ENCOUNTER — Encounter: Payer: Self-pay | Admitting: Radiation Oncology

## 2015-05-02 VITALS — BP 142/84 | HR 96 | Temp 97.8°F | Ht 62.0 in | Wt 193.2 lb

## 2015-05-02 DIAGNOSIS — C50211 Malignant neoplasm of upper-inner quadrant of right female breast: Secondary | ICD-10-CM

## 2015-05-02 DIAGNOSIS — C50412 Malignant neoplasm of upper-outer quadrant of left female breast: Secondary | ICD-10-CM | POA: Diagnosis not present

## 2015-05-02 DIAGNOSIS — Z170 Estrogen receptor positive status [ER+]: Secondary | ICD-10-CM | POA: Diagnosis not present

## 2015-05-02 DIAGNOSIS — Z510 Encounter for antineoplastic radiation therapy: Secondary | ICD-10-CM | POA: Diagnosis not present

## 2015-05-02 NOTE — Progress Notes (Signed)
  Radiation Oncology         [phone] ________________________________  Name: Brianna Jones MRN: [id number]  Date: 05/02/2015  DOB: 1950/05/26  Weekly Radiation Therapy Management    ICD-9-CM ICD-10-CM   1. Breast cancer of upper-inner quadrant of right female breast ([place]) 174.2 C50.211   2. Breast cancer of upper-outer quadrant of left female breast ([facility]) 174.4 C50.412      Current Dose: 19.8 Gy     Planned Dose:  50.4 Gy  Narrative . . . . . . . . The patient presents for routine under treatment assessment.                                  Brianna Jones has completed 11 fractions to her right and left breast. She denies having pain. She reports her energy level has improved. She started having blurry vision and trouble focusing that started today.sHe recently had her prescriptions changes on her glasses and I recommend she discuss this with her optometrist She is using radiaplex. The skin on both breasts is red. She has small scabbed area on her right breast on the side of her nipple. She said she noticed it this weekend.                                  Set-up films were reviewed.                                 The chart was checked. Physical Findings. . .  height is 5\' 2"  (1.575 m) and weight is 193 lb 3.2 oz (87.635 kg). Her oral temperature is 97.8 F (36.6 C). Her blood pressure is 142/84 and her pulse is 96. . Weight essentially stable.  No significant changes.  The lungs are clear. The heart has a regular rhythm and rate. Breast area shows some hyperpigmentation changes. No drainage from scars.   Impression . . . . . . . The patient is tolerating radiation. Plan . . . . . . . . . . . . Continue treatment as planned. She will use Neosporin ointment for the small scabbed area within the right breast  ________________________________   Blair Promise, PhD, MD

## 2015-05-02 NOTE — Progress Notes (Signed)
Brianna Jones has completed 11 fractions to her right and left breat.  She denies having pain.  She reports her energy level has improved.  She started having blurry vision and trouble focusing that started today.  She is using radiaplex.  The skin on both breasts is red.  She has small scabbed area on her right breast on the side of her nipple.  She said she noticed it this weekend.    BP 142/84 mmHg  Pulse 96  Temp(Src) 97.8 F (36.6 C) (Oral)  Ht 5\' 2"  (1.575 m)  Wt 193 lb 3.2 oz (87.635 kg)  BMI 35.33 kg/m2

## 2015-05-03 ENCOUNTER — Ambulatory Visit
Admission: RE | Admit: 2015-05-03 | Discharge: 2015-05-03 | Disposition: A | Discharge status: Home / Self Care | Payer: Medicare Other | Source: Ambulatory Visit | Attending: Radiation Oncology | Admitting: Radiation Oncology

## 2015-05-03 DIAGNOSIS — C50412 Malignant neoplasm of upper-outer quadrant of left female breast: Secondary | ICD-10-CM | POA: Diagnosis not present

## 2015-05-03 DIAGNOSIS — C50211 Malignant neoplasm of upper-inner quadrant of right female breast: Secondary | ICD-10-CM | POA: Diagnosis not present

## 2015-05-03 DIAGNOSIS — Z510 Encounter for antineoplastic radiation therapy: Secondary | ICD-10-CM | POA: Diagnosis not present

## 2015-05-03 DIAGNOSIS — Z170 Estrogen receptor positive status [ER+]: Secondary | ICD-10-CM | POA: Diagnosis not present

## 2015-05-04 ENCOUNTER — Ambulatory Visit
Admission: RE | Admit: 2015-05-04 | Discharge: 2015-05-04 | Disposition: A | Discharge status: Home / Self Care | Payer: Medicare Other | Source: Ambulatory Visit | Attending: Radiation Oncology | Admitting: Radiation Oncology

## 2015-05-04 DIAGNOSIS — C50412 Malignant neoplasm of upper-outer quadrant of left female breast: Secondary | ICD-10-CM | POA: Diagnosis not present

## 2015-05-04 DIAGNOSIS — Z510 Encounter for antineoplastic radiation therapy: Secondary | ICD-10-CM | POA: Diagnosis not present

## 2015-05-04 DIAGNOSIS — Z170 Estrogen receptor positive status [ER+]: Secondary | ICD-10-CM | POA: Diagnosis not present

## 2015-05-04 DIAGNOSIS — C50211 Malignant neoplasm of upper-inner quadrant of right female breast: Secondary | ICD-10-CM | POA: Diagnosis not present

## 2015-05-05 ENCOUNTER — Ambulatory Visit
Admission: RE | Admit: 2015-05-05 | Discharge: 2015-05-05 | Disposition: A | Discharge status: Home / Self Care | Payer: Medicare Other | Source: Ambulatory Visit | Attending: Radiation Oncology | Admitting: Radiation Oncology

## 2015-05-05 DIAGNOSIS — C50412 Malignant neoplasm of upper-outer quadrant of left female breast: Secondary | ICD-10-CM | POA: Diagnosis not present

## 2015-05-05 DIAGNOSIS — Z170 Estrogen receptor positive status [ER+]: Secondary | ICD-10-CM | POA: Diagnosis not present

## 2015-05-05 DIAGNOSIS — C50211 Malignant neoplasm of upper-inner quadrant of right female breast: Secondary | ICD-10-CM | POA: Diagnosis not present

## 2015-05-05 DIAGNOSIS — Z510 Encounter for antineoplastic radiation therapy: Secondary | ICD-10-CM | POA: Diagnosis not present

## 2015-05-06 DIAGNOSIS — M545 Low back pain: Secondary | ICD-10-CM | POA: Diagnosis not present

## 2015-05-08 ENCOUNTER — Ambulatory Visit
Admission: RE | Admit: 2015-05-08 | Discharge: 2015-05-08 | Disposition: A | Discharge status: Home / Self Care | Payer: Medicare Other | Source: Ambulatory Visit | Attending: Radiation Oncology | Admitting: Radiation Oncology

## 2015-05-08 DIAGNOSIS — C50412 Malignant neoplasm of upper-outer quadrant of left female breast: Secondary | ICD-10-CM | POA: Diagnosis not present

## 2015-05-08 DIAGNOSIS — C50211 Malignant neoplasm of upper-inner quadrant of right female breast: Secondary | ICD-10-CM | POA: Diagnosis not present

## 2015-05-08 DIAGNOSIS — Z170 Estrogen receptor positive status [ER+]: Secondary | ICD-10-CM | POA: Diagnosis not present

## 2015-05-08 DIAGNOSIS — Z510 Encounter for antineoplastic radiation therapy: Secondary | ICD-10-CM | POA: Diagnosis not present

## 2015-05-09 ENCOUNTER — Ambulatory Visit
Admission: RE | Admit: 2015-05-09 | Discharge: 2015-05-09 | Disposition: A | Discharge status: Home / Self Care | Payer: Medicare Other | Source: Ambulatory Visit | Attending: Radiation Oncology | Admitting: Radiation Oncology

## 2015-05-09 ENCOUNTER — Encounter: Payer: Self-pay | Admitting: Radiation Oncology

## 2015-05-09 VITALS — BP 122/80 | HR 86 | Temp 98.5°F | Ht 62.0 in | Wt 195.0 lb

## 2015-05-09 DIAGNOSIS — C50211 Malignant neoplasm of upper-inner quadrant of right female breast: Secondary | ICD-10-CM | POA: Insufficient documentation

## 2015-05-09 DIAGNOSIS — Z170 Estrogen receptor positive status [ER+]: Secondary | ICD-10-CM | POA: Diagnosis not present

## 2015-05-09 DIAGNOSIS — Z510 Encounter for antineoplastic radiation therapy: Secondary | ICD-10-CM | POA: Diagnosis not present

## 2015-05-09 DIAGNOSIS — C50412 Malignant neoplasm of upper-outer quadrant of left female breast: Secondary | ICD-10-CM

## 2015-05-09 DIAGNOSIS — Z923 Personal history of irradiation: Secondary | ICD-10-CM | POA: Insufficient documentation

## 2015-05-09 MED ORDER — RADIAPLEXRX EX GEL
Freq: Once | CUTANEOUS | Status: AC
  Administered 2015-05-09: 17:00:00 via TOPICAL

## 2015-05-09 NOTE — Progress Notes (Signed)
  Radiation Oncology         [phone] ________________________________  Name: Brianna Jones MRN: [id number]  Date: 05/09/2015  DOB: [birth year]  Weekly Radiation Therapy Management    ICD-9-CM ICD-10-CM   1. Breast cancer of upper-outer quadrant of left female breast ([facility]) 174.4 C50.412 hyaluronate sodium (RADIAPLEXRX) gel  2. Breast cancer of upper-inner quadrant of right female breast ([facility]) 174.2 C50.211      Current Dose: 28.8 Gy     Planned Dose:  50.4 Gy  Narrative . . . . . . . . The patient presents for routine under treatment assessment.                                   Brianna Jones has received 16 fractions to her right and left breast. Note redness of both with warmth. Skin intact. She Admits to worsening fatigue. " I don't have as much energy as I used to when I leave here every day". She continues to work her full-time schedule from 7 to approximately 2 PM daily                                 Set-up films were reviewed.                                 The chart was checked. Physical Findings. . .  height is 5\' 2"  (1.575 m) and weight is 195 lb (88.451 kg). Her temperature is 98.5 F (36.9 C). Her blood pressure is 122/80 and her pulse is 86. . The lungs are clear. The heart has regular rhythm and rate. The left and right breast area shows erythema but no skin breakdown. Impression . . . . . . . The patient is tolerating radiation. Plan . . . . . . . . . . . . Continue treatment as planned.  ________________________________   Blair Promise, PhD, MD

## 2015-05-09 NOTE — Progress Notes (Signed)
Ms. Rzepecki has received 16 fractions to her right and left breast.  Note redness of both with warmth.  Skin intact. She  Admits to worsening fatigue.  " I don't have as much energy as I used to when I leave here every day".

## 2015-05-10 ENCOUNTER — Ambulatory Visit
Admission: RE | Admit: 2015-05-10 | Discharge: 2015-05-10 | Disposition: A | Discharge status: Home / Self Care | Payer: Medicare Other | Source: Ambulatory Visit | Attending: Radiation Oncology | Admitting: Radiation Oncology

## 2015-05-10 DIAGNOSIS — C50211 Malignant neoplasm of upper-inner quadrant of right female breast: Secondary | ICD-10-CM | POA: Diagnosis not present

## 2015-05-10 DIAGNOSIS — Z510 Encounter for antineoplastic radiation therapy: Secondary | ICD-10-CM | POA: Diagnosis not present

## 2015-05-10 DIAGNOSIS — Z170 Estrogen receptor positive status [ER+]: Secondary | ICD-10-CM | POA: Diagnosis not present

## 2015-05-10 DIAGNOSIS — C50412 Malignant neoplasm of upper-outer quadrant of left female breast: Secondary | ICD-10-CM | POA: Diagnosis not present

## 2015-05-10 DIAGNOSIS — M4806 Spinal stenosis, lumbar region: Secondary | ICD-10-CM | POA: Diagnosis not present

## 2015-05-11 ENCOUNTER — Ambulatory Visit
Admission: RE | Admit: 2015-05-11 | Discharge: 2015-05-11 | Disposition: A | Discharge status: Home / Self Care | Payer: Medicare Other | Source: Ambulatory Visit | Attending: Radiation Oncology | Admitting: Radiation Oncology

## 2015-05-11 DIAGNOSIS — Z510 Encounter for antineoplastic radiation therapy: Secondary | ICD-10-CM | POA: Diagnosis not present

## 2015-05-11 DIAGNOSIS — C50412 Malignant neoplasm of upper-outer quadrant of left female breast: Secondary | ICD-10-CM | POA: Diagnosis not present

## 2015-05-11 DIAGNOSIS — Z170 Estrogen receptor positive status [ER+]: Secondary | ICD-10-CM | POA: Diagnosis not present

## 2015-05-11 DIAGNOSIS — C50211 Malignant neoplasm of upper-inner quadrant of right female breast: Secondary | ICD-10-CM | POA: Diagnosis not present

## 2015-05-12 ENCOUNTER — Ambulatory Visit
Admission: RE | Admit: 2015-05-12 | Discharge: 2015-05-12 | Disposition: A | Discharge status: Home / Self Care | Payer: Medicare Other | Source: Ambulatory Visit | Attending: Radiation Oncology | Admitting: Radiation Oncology

## 2015-05-12 DIAGNOSIS — Z510 Encounter for antineoplastic radiation therapy: Secondary | ICD-10-CM | POA: Diagnosis not present

## 2015-05-12 DIAGNOSIS — C50412 Malignant neoplasm of upper-outer quadrant of left female breast: Secondary | ICD-10-CM | POA: Diagnosis not present

## 2015-05-12 DIAGNOSIS — Z170 Estrogen receptor positive status [ER+]: Secondary | ICD-10-CM | POA: Diagnosis not present

## 2015-05-12 DIAGNOSIS — C50211 Malignant neoplasm of upper-inner quadrant of right female breast: Secondary | ICD-10-CM | POA: Diagnosis not present

## 2015-05-15 ENCOUNTER — Ambulatory Visit
Admission: RE | Admit: 2015-05-15 | Discharge: 2015-05-15 | Disposition: A | Discharge status: Home / Self Care | Payer: Medicare Other | Source: Ambulatory Visit | Attending: Radiation Oncology | Admitting: Radiation Oncology

## 2015-05-15 DIAGNOSIS — Z170 Estrogen receptor positive status [ER+]: Secondary | ICD-10-CM | POA: Diagnosis not present

## 2015-05-15 DIAGNOSIS — C50211 Malignant neoplasm of upper-inner quadrant of right female breast: Secondary | ICD-10-CM | POA: Diagnosis not present

## 2015-05-15 DIAGNOSIS — C50412 Malignant neoplasm of upper-outer quadrant of left female breast: Secondary | ICD-10-CM | POA: Diagnosis not present

## 2015-05-15 DIAGNOSIS — Z510 Encounter for antineoplastic radiation therapy: Secondary | ICD-10-CM | POA: Diagnosis not present

## 2015-05-16 ENCOUNTER — Ambulatory Visit
Admission: RE | Admit: 2015-05-16 | Discharge: 2015-05-16 | Disposition: A | Discharge status: Home / Self Care | Payer: Medicare Other | Source: Ambulatory Visit | Attending: Radiation Oncology | Admitting: Radiation Oncology

## 2015-05-16 ENCOUNTER — Encounter: Payer: Self-pay | Admitting: Radiation Oncology

## 2015-05-16 VITALS — BP 128/74 | HR 82 | Resp 16 | Wt 193.2 lb

## 2015-05-16 DIAGNOSIS — C50211 Malignant neoplasm of upper-inner quadrant of right female breast: Secondary | ICD-10-CM

## 2015-05-16 DIAGNOSIS — C50412 Malignant neoplasm of upper-outer quadrant of left female breast: Secondary | ICD-10-CM | POA: Diagnosis not present

## 2015-05-16 DIAGNOSIS — Z510 Encounter for antineoplastic radiation therapy: Secondary | ICD-10-CM | POA: Diagnosis not present

## 2015-05-16 DIAGNOSIS — Z170 Estrogen receptor positive status [ER+]: Secondary | ICD-10-CM | POA: Diagnosis not present

## 2015-05-16 NOTE — Progress Notes (Signed)
  Radiation Oncology         [phone] ________________________________  Name: Brianna Jones MRN: [id number]  Date: 05/16/2015  DOB: September 11, 1950  Weekly Radiation Therapy Management    ICD-9-CM ICD-10-CM   1. Breast cancer of upper-inner quadrant of right female breast ([place]) 174.2 C50.211   2. Breast cancer of upper-outer quadrant of left female breast ([facility]) 174.4 C50.412      Current Dose: 37.8 Gy     Planned Dose:  50.4 Gy  Narrative . . . . . . . . The patient presents for routine under treatment assessment.                                   Denies pain. Reports she has stopped all pain medication because she is scheduled for a spinal injection on 4/27. Hyperpigmentation without desquamation of right/treated breast. Reports using radiaplex bid as directed. No indications of lymphedema noted. Reports fatigue. The nurse provided the patient with a tube of radiaplex today.                                  Set-up films were reviewed.                                 The chart was checked. Physical Findings. . .  weight is 193 lb 3.2 oz (87.635 kg). Her blood pressure is 128/74 and her pulse is 82. Her respiration is 16 and oxygen saturation is 100%. . The lungs are clear. The heart has regular rhythm and rate. The left and right breast areas shows hyperpigmentation changes and erythema, but no skin breakdown. Impression . . . . . . . The patient is tolerating radiation. Plan . . . . . . . . . . . . Continue treatment as planned.  ________________________________   Blair Promise, PhD, MD  This document serves as a record of services personally performed by Gery Pray, MD. It was created on his behalf by Darcus Austin, a trained medical scribe. The creation of this record is based on the scribe's personal observations and the provider's statements to them. This document has been checked and approved by the attending provider.

## 2015-05-16 NOTE — Progress Notes (Signed)
Weight and vitals stable. Denies pain. Reports she has stopped all pain medication because she is scheduled for a spinal injection on 4/27. Hyperpigmentation without desquamation of right/treated breast. Reports using radiaplex bid as directed. No indications of lymphedema noted. Reports fatigue. Provided patient with tube of radiaplex today.   BP 128/74 mmHg  Pulse 82  Resp 16  Wt 193 lb 3.2 oz (87.635 kg)  SpO2 100% Wt Readings from Last 3 Encounters:  05/16/15 193 lb 3.2 oz (87.635 kg)  05/09/15 195 lb (88.451 kg)  05/02/15 193 lb 3.2 oz (87.635 kg)

## 2015-05-17 ENCOUNTER — Ambulatory Visit
Admission: RE | Admit: 2015-05-17 | Discharge: 2015-05-17 | Disposition: A | Discharge status: Home / Self Care | Payer: Medicare Other | Source: Ambulatory Visit | Attending: Radiation Oncology | Admitting: Radiation Oncology

## 2015-05-17 DIAGNOSIS — C50211 Malignant neoplasm of upper-inner quadrant of right female breast: Secondary | ICD-10-CM | POA: Diagnosis not present

## 2015-05-17 DIAGNOSIS — Z170 Estrogen receptor positive status [ER+]: Secondary | ICD-10-CM | POA: Diagnosis not present

## 2015-05-17 DIAGNOSIS — C50412 Malignant neoplasm of upper-outer quadrant of left female breast: Secondary | ICD-10-CM

## 2015-05-17 DIAGNOSIS — M5416 Radiculopathy, lumbar region: Secondary | ICD-10-CM | POA: Diagnosis not present

## 2015-05-17 DIAGNOSIS — M4806 Spinal stenosis, lumbar region: Secondary | ICD-10-CM | POA: Diagnosis not present

## 2015-05-17 DIAGNOSIS — Z510 Encounter for antineoplastic radiation therapy: Secondary | ICD-10-CM | POA: Diagnosis not present

## 2015-05-17 MED ORDER — RADIAPLEXRX EX GEL
Freq: Once | CUTANEOUS | Status: AC
  Administered 2015-05-17: 09:00:00 via TOPICAL

## 2015-05-18 ENCOUNTER — Ambulatory Visit
Admission: RE | Admit: 2015-05-18 | Discharge: 2015-05-18 | Disposition: A | Discharge status: Home / Self Care | Payer: Medicare Other | Source: Ambulatory Visit | Attending: Radiation Oncology | Admitting: Radiation Oncology

## 2015-05-18 DIAGNOSIS — Z170 Estrogen receptor positive status [ER+]: Secondary | ICD-10-CM | POA: Diagnosis not present

## 2015-05-18 DIAGNOSIS — C50211 Malignant neoplasm of upper-inner quadrant of right female breast: Secondary | ICD-10-CM | POA: Diagnosis not present

## 2015-05-18 DIAGNOSIS — C50412 Malignant neoplasm of upper-outer quadrant of left female breast: Secondary | ICD-10-CM | POA: Diagnosis not present

## 2015-05-18 DIAGNOSIS — M4806 Spinal stenosis, lumbar region: Secondary | ICD-10-CM | POA: Diagnosis not present

## 2015-05-18 DIAGNOSIS — Z510 Encounter for antineoplastic radiation therapy: Secondary | ICD-10-CM | POA: Diagnosis not present

## 2015-05-18 DIAGNOSIS — M5416 Radiculopathy, lumbar region: Secondary | ICD-10-CM | POA: Diagnosis not present

## 2015-05-19 ENCOUNTER — Ambulatory Visit
Admission: RE | Admit: 2015-05-19 | Discharge: 2015-05-19 | Disposition: A | Discharge status: Home / Self Care | Payer: Medicare Other | Source: Ambulatory Visit | Attending: Radiation Oncology | Admitting: Radiation Oncology

## 2015-05-19 DIAGNOSIS — C50412 Malignant neoplasm of upper-outer quadrant of left female breast: Secondary | ICD-10-CM | POA: Diagnosis not present

## 2015-05-19 DIAGNOSIS — Z170 Estrogen receptor positive status [ER+]: Secondary | ICD-10-CM | POA: Diagnosis not present

## 2015-05-19 DIAGNOSIS — C50211 Malignant neoplasm of upper-inner quadrant of right female breast: Secondary | ICD-10-CM | POA: Diagnosis not present

## 2015-05-19 DIAGNOSIS — Z510 Encounter for antineoplastic radiation therapy: Secondary | ICD-10-CM | POA: Diagnosis not present

## 2015-05-22 ENCOUNTER — Ambulatory Visit
Admission: RE | Admit: 2015-05-22 | Discharge: 2015-05-22 | Disposition: A | Discharge status: Home / Self Care | Payer: Medicare Other | Source: Ambulatory Visit | Attending: Radiation Oncology | Admitting: Radiation Oncology

## 2015-05-22 DIAGNOSIS — Z170 Estrogen receptor positive status [ER+]: Secondary | ICD-10-CM | POA: Diagnosis not present

## 2015-05-22 DIAGNOSIS — C50412 Malignant neoplasm of upper-outer quadrant of left female breast: Secondary | ICD-10-CM | POA: Diagnosis not present

## 2015-05-22 DIAGNOSIS — Z510 Encounter for antineoplastic radiation therapy: Secondary | ICD-10-CM | POA: Diagnosis not present

## 2015-05-22 DIAGNOSIS — C50211 Malignant neoplasm of upper-inner quadrant of right female breast: Secondary | ICD-10-CM | POA: Diagnosis not present

## 2015-05-23 ENCOUNTER — Ambulatory Visit
Admission: RE | Admit: 2015-05-23 | Discharge: 2015-05-23 | Disposition: A | Discharge status: Home / Self Care | Payer: Medicare Other | Source: Ambulatory Visit | Attending: Radiation Oncology | Admitting: Radiation Oncology

## 2015-05-23 ENCOUNTER — Encounter: Payer: Self-pay | Admitting: Radiation Oncology

## 2015-05-23 VITALS — BP 132/84 | HR 103 | Temp 98.2°F | Ht 62.0 in | Wt 188.7 lb

## 2015-05-23 DIAGNOSIS — C50211 Malignant neoplasm of upper-inner quadrant of right female breast: Secondary | ICD-10-CM | POA: Diagnosis not present

## 2015-05-23 DIAGNOSIS — Z510 Encounter for antineoplastic radiation therapy: Secondary | ICD-10-CM | POA: Diagnosis not present

## 2015-05-23 DIAGNOSIS — C50412 Malignant neoplasm of upper-outer quadrant of left female breast: Secondary | ICD-10-CM

## 2015-05-23 DIAGNOSIS — Z170 Estrogen receptor positive status [ER+]: Secondary | ICD-10-CM | POA: Diagnosis not present

## 2015-05-23 MED ORDER — RADIAPLEXRX EX GEL
Freq: Once | CUTANEOUS | Status: AC
  Administered 2015-05-23: 17:00:00 via TOPICAL

## 2015-05-23 MED ORDER — TRAMADOL HCL 50 MG PO TABS: 50.0000 mg | ORAL_TABLET | Freq: Four times a day (QID) | ORAL | Status: DC | PRN

## 2015-05-23 NOTE — Progress Notes (Signed)
  Radiation Oncology         [phone] ________________________________  Name: Brianna Jones MRN: [id number]  Date: 05/23/2015  DOB: October 24, 1950  Weekly Radiation Therapy Management    ICD-9-CM ICD-10-CM   1. Breast cancer of upper-outer quadrant of left female breast ([facility]) 174.4 C50.412 hyaluronate sodium (RADIAPLEXRX) gel  2. Breast cancer of upper-inner quadrant of right female breast ([facility]) 174.2 C50.211      Current Dose: 46.8 Gy     Planned Dose:  50.4 Gy  Narrative . . . . . . . . The patient presents for routine under treatment assessment.                                   Brianna Jones has completed 26 fractions to her left and right breast. She denies having pain, but does report feeling fatigued. She also said she has lost her appetite. She has lost 7 lbs since 05/09/15. She is using radiaplex and has been given a refill. The skin on both breast is red. She has small open areas along her scar under her left breast. The patient is almost out of her tramadol. She states her orthopedic prescribes it.                                 Set-up films were reviewed.                                 The chart was checked. Physical Findings. . .  height is 5\' 2"  (1.575 m) and weight is 188 lb 11.2 oz (85.594 kg). Her oral temperature is 98.2 F (36.8 C). Her blood pressure is 132/84 and her pulse is 103. . The lungs are clear. The heart has regular rhythm and rate. Brisk erythema and hyperpigmentation changes throughout both breasts. Slight amount of yellow drainage from her scars. Impression . . . . . . . The patient is tolerating radiation. Plan . . . . . . . . . . . . Continue treatment as planned. I recommended neosporin to the areas of her scars where there is yellow drainage. I will refill her tramadol. ________________________________   Blair Promise, PhD, MD  This document serves as a record of services personally performed by Gery Pray, MD. It was created on his behalf by  Darcus Austin, a trained medical scribe. The creation of this record is based on the scribe's personal observations and the provider's statements to them. This document has been checked and approved by the attending provider.

## 2015-05-23 NOTE — Progress Notes (Signed)
Brianna Jones has completed 26 fractions to her left and right breast.  She denies having pain but does report feeling fatigue.  She also said she has lost her appetite.  She has lost 7 lbs since 05/09/15.  She is using radiaplex and has been given a refill.  The skin on both breast is red.  She has small open areas along her scar under her left breast.  BP 132/84 mmHg  Pulse 103  Temp(Src) 98.2 F (36.8 C) (Oral)  Ht 5\' 2"  (1.575 m)  Wt 188 lb 11.2 oz (85.594 kg)  BMI 34.51 kg/m2   Wt Readings from Last 3 Encounters:  05/23/15 188 lb 11.2 oz (85.594 kg)  05/16/15 193 lb 3.2 oz (87.635 kg)  05/09/15 195 lb (88.451 kg)

## 2015-05-24 ENCOUNTER — Ambulatory Visit
Admission: RE | Admit: 2015-05-24 | Discharge: 2015-05-24 | Disposition: A | Discharge status: Home / Self Care | Payer: Medicare Other | Source: Ambulatory Visit | Attending: Radiation Oncology | Admitting: Radiation Oncology

## 2015-05-24 ENCOUNTER — Ambulatory Visit: Payer: Medicare Other

## 2015-05-24 DIAGNOSIS — Z510 Encounter for antineoplastic radiation therapy: Secondary | ICD-10-CM | POA: Diagnosis not present

## 2015-05-24 DIAGNOSIS — M4806 Spinal stenosis, lumbar region: Secondary | ICD-10-CM | POA: Diagnosis not present

## 2015-05-24 DIAGNOSIS — M5416 Radiculopathy, lumbar region: Secondary | ICD-10-CM | POA: Diagnosis not present

## 2015-05-24 DIAGNOSIS — C50412 Malignant neoplasm of upper-outer quadrant of left female breast: Secondary | ICD-10-CM | POA: Diagnosis not present

## 2015-05-24 DIAGNOSIS — C50211 Malignant neoplasm of upper-inner quadrant of right female breast: Secondary | ICD-10-CM | POA: Diagnosis not present

## 2015-05-24 DIAGNOSIS — Z170 Estrogen receptor positive status [ER+]: Secondary | ICD-10-CM | POA: Diagnosis not present

## 2015-05-25 ENCOUNTER — Ambulatory Visit
Admission: RE | Admit: 2015-05-25 | Discharge: 2015-05-25 | Disposition: A | Discharge status: Home / Self Care | Payer: Medicare Other | Source: Ambulatory Visit | Attending: Radiation Oncology | Admitting: Radiation Oncology

## 2015-05-25 ENCOUNTER — Telehealth: Payer: Self-pay | Admitting: *Deleted

## 2015-05-25 ENCOUNTER — Ambulatory Visit: Payer: Medicare Other

## 2015-05-25 ENCOUNTER — Encounter: Payer: Self-pay | Admitting: Radiation Oncology

## 2015-05-25 DIAGNOSIS — C50211 Malignant neoplasm of upper-inner quadrant of right female breast: Secondary | ICD-10-CM | POA: Diagnosis not present

## 2015-05-25 DIAGNOSIS — Z510 Encounter for antineoplastic radiation therapy: Secondary | ICD-10-CM | POA: Diagnosis not present

## 2015-05-25 DIAGNOSIS — M4806 Spinal stenosis, lumbar region: Secondary | ICD-10-CM | POA: Diagnosis not present

## 2015-05-25 DIAGNOSIS — Z170 Estrogen receptor positive status [ER+]: Secondary | ICD-10-CM | POA: Diagnosis not present

## 2015-05-25 DIAGNOSIS — C50412 Malignant neoplasm of upper-outer quadrant of left female breast: Secondary | ICD-10-CM | POA: Diagnosis not present

## 2015-05-25 NOTE — Telephone Encounter (Signed)
  Oncology Nurse Navigator Documentation    Navigator Encounter Type: Telephone (05/25/15 1500) Telephone: Outgoing Call (05/25/15 1500) Abnormal Finding Date: 12/30/14 (05/25/15 1500) Confirmed Diagnosis Date: 01/06/15 (05/25/15 1500) Surgery Date: 02/23/15 (05/25/15 1500) Treatment Initiated Date: 02/23/15 (05/25/15 1500) Patient Visit Type: RadOnc (05/25/15 1500) Treatment Phase: Final Radiation Tx (05/25/15 1500) Barriers/Navigation Needs: No barriers at this time;No Questions;No Needs (05/25/15 1500)   Interventions: Referrals (05/25/15 1500) Referrals: Survivorship (05/25/15 1500)          Acuity: Level 1 (05/25/15 1500)         Time Spent with Patient: 15 (05/25/15 1500)

## 2015-05-26 ENCOUNTER — Other Ambulatory Visit: Payer: Self-pay | Admitting: Adult Health

## 2015-05-26 ENCOUNTER — Telehealth: Payer: Self-pay | Admitting: Hematology and Oncology

## 2015-05-26 DIAGNOSIS — C50412 Malignant neoplasm of upper-outer quadrant of left female breast: Secondary | ICD-10-CM

## 2015-05-26 DIAGNOSIS — C50211 Malignant neoplasm of upper-inner quadrant of right female breast: Secondary | ICD-10-CM

## 2015-05-26 NOTE — Telephone Encounter (Signed)
lmom to inform patient of appt time change to 100 on 5/18

## 2015-05-29 DIAGNOSIS — M4806 Spinal stenosis, lumbar region: Secondary | ICD-10-CM | POA: Diagnosis not present

## 2015-05-29 DIAGNOSIS — M5416 Radiculopathy, lumbar region: Secondary | ICD-10-CM | POA: Diagnosis not present

## 2015-05-31 DIAGNOSIS — M4806 Spinal stenosis, lumbar region: Secondary | ICD-10-CM | POA: Diagnosis not present

## 2015-05-31 DIAGNOSIS — M5416 Radiculopathy, lumbar region: Secondary | ICD-10-CM | POA: Diagnosis not present

## 2015-06-07 DIAGNOSIS — M5416 Radiculopathy, lumbar region: Secondary | ICD-10-CM | POA: Diagnosis not present

## 2015-06-07 DIAGNOSIS — M4806 Spinal stenosis, lumbar region: Secondary | ICD-10-CM | POA: Diagnosis not present

## 2015-06-07 DIAGNOSIS — C50211 Malignant neoplasm of upper-inner quadrant of right female breast: Secondary | ICD-10-CM | POA: Diagnosis not present

## 2015-06-12 DIAGNOSIS — M4806 Spinal stenosis, lumbar region: Secondary | ICD-10-CM | POA: Diagnosis not present

## 2015-06-12 DIAGNOSIS — M5416 Radiculopathy, lumbar region: Secondary | ICD-10-CM | POA: Diagnosis not present

## 2015-06-14 DIAGNOSIS — M4806 Spinal stenosis, lumbar region: Secondary | ICD-10-CM | POA: Diagnosis not present

## 2015-06-14 DIAGNOSIS — M5416 Radiculopathy, lumbar region: Secondary | ICD-10-CM | POA: Diagnosis not present

## 2015-06-14 NOTE — Progress Notes (Signed)
  Radiation Oncology         [phone] ________________________________  Name: Brianna Jones MRN: [id number]  Date: 05/25/2015  DOB: 09/18/1950  End of Treatment Note  DIAGNOSIS: Right Breast: stage T1a N0 Invasive Lobular and LCIS (ER 95%, PR 0%) Left Breast: Tis N0 stage 0 DCIS with calcifications (ER 100%, PR 80%,)  Indication for treatment:  Curative       Radiation treatment dates:   04/17/2015-05/25/2015  Site/dose:    1. The Right breast was treated to 50.4 Gy in 28 fractions at 1.8 Gy per fraction.  2. The Left breast was treated to 50.4 Gy in 28 fractions at 1.8 Gy per fraction.  Beams/energy:    1. 3D-Conformal (deprecated) // 10X, 6X, tangent beams 2. 3D-Conformal (deprecated) // 10X, 6X   Tangent beams  Narrative: The patient tolerated radiation treatment relatively well.   She experienced fatigue, poor appetite, and weight loss. There was brisk erythema and hyperpigmentation changes throughout both breasts with a slight amount of yellow drainage from her scars.   Plan: The patient has completed radiation treatment. The patient will return to radiation oncology clinic for routine followup in one month. I advised them to call or return sooner if they have any questions or concerns related to their recovery or treatment.  -----------------------------------  Blair Promise, PhD, MD  This document serves as a record of services personally performed by Gery Pray, MD. It was created on his behalf by Arlyce Harman, a trained medical scribe. The creation of this record is based on the scribe's personal observations and the provider's statements to them. This document has been checked and approved by the attending provider.

## 2015-06-15 ENCOUNTER — Ambulatory Visit: Payer: Medicare Other | Admitting: Hematology and Oncology

## 2015-06-22 ENCOUNTER — Ambulatory Visit (HOSPITAL_BASED_OUTPATIENT_CLINIC_OR_DEPARTMENT_OTHER): Payer: Medicare Other | Admitting: Hematology and Oncology

## 2015-06-22 ENCOUNTER — Telehealth: Payer: Self-pay | Admitting: Hematology and Oncology

## 2015-06-22 ENCOUNTER — Encounter: Payer: Self-pay | Admitting: Hematology and Oncology

## 2015-06-22 VITALS — BP 128/80 | HR 91 | Temp 98.6°F | Resp 18 | Wt 189.8 lb

## 2015-06-22 DIAGNOSIS — C50412 Malignant neoplasm of upper-outer quadrant of left female breast: Secondary | ICD-10-CM | POA: Diagnosis not present

## 2015-06-22 DIAGNOSIS — C50211 Malignant neoplasm of upper-inner quadrant of right female breast: Secondary | ICD-10-CM | POA: Diagnosis not present

## 2015-06-22 MED ORDER — ANASTROZOLE 1 MG PO TABS: 1.0000 mg | ORAL_TABLET | Freq: Every day | ORAL | Status: DC

## 2015-06-22 NOTE — Assessment & Plan Note (Signed)
Left lumpectomy: DCIS with calcifications, 4 mm margin, ER 95%, PR 0%, HER-2 insufficient,  Pathological stage: Tis N0 stage 0 Adjuvant radiation therapy completed 05/25/2015 Treatment plan: Adjuvant anastrozole 1 mg daily 5 years  Anastrozole counseling: We discussed the risks and benefits of anti-estrogen therapy with aromatase inhibitors. These include but not limited to insomnia, hot flashes, mood changes, vaginal dryness, bone density loss, and weight gain. We strongly believe that the benefits far outweigh the risks. Patient understands these risks and consented to starting treatment. Planned treatment duration is 5 years.

## 2015-06-22 NOTE — Telephone Encounter (Signed)
appt made and avs printed °

## 2015-06-22 NOTE — Assessment & Plan Note (Signed)
RIGHT breast biopsy: 12:30: Invasive lobular carcinoma grade 1, ER 95%, PR 0% insufficient for her to our Ki-67; LCIS with necrosis and calcifications; LEFT breast biopsy upper outer quadrant: DCIS with necrosis and calcifications, ER 100%, PR 80% Right lumpectomy 02/23/2015: ALH, LCIS, 0/1 lymph node Pathological staging: T1a N0 stage IA Adjuvant radiation therapy completed 05/25/2015  Current treatment: Adjuvant anastrozole 1 mg daily 5 years  Return to clinic in 3 months for toxicity check and follow

## 2015-06-22 NOTE — Progress Notes (Signed)
Patient Care Team: Glendale Chard, MD as PCP - General (Internal Medicine) Alphonsa Overall, MD as Consulting Physician (General Surgery) Nicholas Lose, MD as Consulting Physician (Hematology and Oncology) Gery Pray, MD as Consulting Physician (Radiation Oncology) Sylvan Cheese, NP as Nurse Practitioner (Hematology and Oncology) Irene Limbo, MD as Consulting Physician (Plastic Surgery)  DIAGNOSIS: Breast cancer of upper-inner quadrant of right female breast [facility])   Staging form: Breast, AJCC 7th Edition     Clinical stage from 01/18/2015: Stage IA (T1c, N0, M0) - Unsigned Breast cancer of upper-outer quadrant of left female breast [facility])   Staging form: Breast, AJCC 7th Edition     Clinical stage from 01/18/2015: Stage 0 (Tis (DCIS), N0, M0) - Unsigned   SUMMARY OF ONCOLOGIC HISTORY:   Breast cancer of upper-inner quadrant of right female breast ([place])   12/30/2014 Mammogram Right breast mass at 12:30: 1 x 0.6 x 1.1 cm; left breast calcifications spanning 4 cm with a titer group measuring 5 mm, no pathologic lymph nodes   01/06/2015 Initial Diagnosis RIGHT breast biopsy: 12:30: Invasive lobular carcinoma grade 1, ER 95%, PR 0% insufficient for her to our Ki-67; LCIS with necrosis and calcifications; LEFT breast biopsy upper outer quadrant: DCIS with necrosis and calcifications, ER 100%, PR 80%   02/23/2015 Surgery Right lumpectomy: ALH, LCIS, 0/1 lymph node   04/17/2015 - 05/25/2015 Radiation Therapy Adjuvant radiation therapy    Breast cancer of upper-outer quadrant of left female breast ([place])   02/23/2015 Surgery Left lumpectomy: DCIS with calcifications, 4 mm margin, ER 95%, PR 0%, HER-2 insufficient, Tis N0 stage 0   04/17/2015 - 05/25/2015 Radiation Therapy Adjuvant radiation therapy    CHIEF COMPLIANT: Follow-up after radiation therapy  INTERVAL HISTORY: Brianna Jones is a 65 year old with above-mentioned history of bilateral breast cancers underwent lumpectomy and  completed radiation therapy. She is here to discuss starting antiestrogen therapy. She has recovered fairly well from effects of radiation. She does feel mildly fatigued.  REVIEW OF SYSTEMS:   Constitutional: Denies fevers, chills or abnormal weight loss Eyes: Denies blurriness of vision Ears, nose, mouth, throat, and face: Denies mucositis or sore throat Respiratory: Denies cough, dyspnea or wheezes Cardiovascular: Denies palpitation, chest discomfort Gastrointestinal:  Denies nausea, heartburn or change in bowel habits Skin: Denies abnormal skin rashes Lymphatics: Denies new lymphadenopathy or easy bruising Neurological:Denies numbness, tingling or new weaknesses Behavioral/Psych: Mood is stable, no new changes  Extremities: No lower extremity edema Breast:  denies any pain or lumps or nodules in either breasts All other systems were reviewed with the patient and are negative.  I have reviewed the past medical history, past surgical history, social history and family history with the patient and they are unchanged from previous note.  ALLERGIES:  has No Known Allergies.  MEDICATIONS:  Current Outpatient Prescriptions  Medication Sig Dispense Refill  . amLODipine (NORVASC) 10 MG tablet Take 10 mg by mouth daily.    . Cholecalciferol (VITAMIN D3) 5000 UNITS CAPS Take 1 capsule by mouth daily. Reported on 03/15/2015    . gabapentin (NEURONTIN) 300 MG capsule Take 300-600 mg by mouth 2 (two) times daily. 1 capsule in the morning, 2 capsules at bedtime    . hyaluronate sodium (RADIAPLEXRX) GEL Apply 1 application topically once.    . meloxicam (MOBIC) 15 MG tablet Reported on 7/82/9562  0  . non-metallic deodorant (ALRA) MISC Apply 1 application topically daily as needed.    Marland Kitchen omega-3 acid ethyl esters (LOVAZA) 1 g capsule Take  by mouth 2 (two) times daily.    Marland Kitchen oxyCODONE-acetaminophen (PERCOCET/ROXICET) 5-325 MG tablet Take by mouth every 4 (four) hours as needed for severe pain. Reported  on 05/23/2015    . tiZANidine (ZANAFLEX) 4 MG tablet Take 4 mg by mouth at bedtime. Reported on 05/23/2015  6  . traMADol (ULTRAM) 50 MG tablet Take 1 tablet (50 mg total) by mouth every 6 (six) hours as needed for moderate pain. 30 tablet 0  . valsartan-hydrochlorothiazide (DIOVAN-HCT) 160-25 MG tablet Take 1 tablet by mouth daily.     No current facility-administered medications for this visit.    PHYSICAL EXAMINATION: ECOG PERFORMANCE STATUS: 1 - Symptomatic but completely ambulatory  Filed Vitals:   06/22/15 1443  BP: 128/80  Pulse: 91  Temp: 98.6 F (37 C)  Resp: 18   Filed Weights   06/22/15 1443  Weight: 189 lb 12.8 oz (86.093 kg)    GENERAL:alert, no distress and comfortable SKIN: skin color, texture, turgor are normal, no rashes or significant lesions EYES: normal, Conjunctiva are pink and non-injected, sclera clear OROPHARYNX:no exudate, no erythema and lips, buccal mucosa, and tongue normal  NECK: supple, thyroid normal size, non-tender, without nodularity LYMPH:  no palpable lymphadenopathy in the cervical, axillary or inguinal LUNGS: clear to auscultation and percussion with normal breathing effort HEART: regular rate & rhythm and no murmurs and no lower extremity edema ABDOMEN:abdomen soft, non-tender and normal bowel sounds MUSCULOSKELETAL:no cyanosis of digits and no clubbing  NEURO: alert & oriented x 3 with fluent speech, no focal motor/sensory deficits EXTREMITIES: No lower extremity edema  LABORATORY DATA:  I have reviewed the data as listed   Chemistry      Component Value Date/Time   NA 140 02/15/2015 1433   NA 140 01/18/2015 0827   K 3.9 02/15/2015 1433   K 3.6 01/18/2015 0827   CL 102 02/15/2015 1433   CO2 29 02/15/2015 1433   CO2 28 01/18/2015 0827   BUN 15 02/15/2015 1433   BUN 11.8 01/18/2015 0827   CREATININE 0.84 02/15/2015 1433   CREATININE 0.9 01/18/2015 0827      Component Value Date/Time   CALCIUM 10.3 02/15/2015 1433   CALCIUM  10.0 01/18/2015 0827   ALKPHOS 63 02/15/2015 1433   ALKPHOS 75 01/18/2015 0827   AST 25 02/15/2015 1433   AST 25 01/18/2015 0827   ALT 27 02/15/2015 1433   ALT 22 01/18/2015 0827   BILITOT 0.5 02/15/2015 1433   BILITOT 0.35 01/18/2015 0827       Lab Results  Component Value Date   WBC 7.7 02/15/2015   HGB 13.2 02/15/2015   HCT 40.0 02/15/2015   MCV 88.7 02/15/2015   PLT 185 02/15/2015   NEUTROABS 5.1 02/15/2015     ASSESSMENT & PLAN:  Breast cancer of upper-outer quadrant of left female breast ([facility]) Left lumpectomy: DCIS with calcifications, 4 mm margin, ER 95%, PR 0%, HER-2 insufficient,  Pathological stage: Tis N0 stage 0 Adjuvant radiation therapy completed 05/25/2015 Treatment plan: Adjuvant anastrozole 1 mg daily 5 years  Anastrozole counseling: We discussed the risks and benefits of anti-estrogen therapy with aromatase inhibitors. These include but not limited to insomnia, hot flashes, mood changes, vaginal dryness, bone density loss, and weight gain. We strongly believe that the benefits far outweigh the risks. Patient understands these risks and consented to starting treatment. Planned treatment duration is 5 years.   Breast cancer of upper-inner quadrant of right female breast [facility]) RIGHT breast biopsy: 12:30: Invasive lobular  carcinoma grade 1, ER 95%, PR 0% insufficient for her to our Ki-67; LCIS with necrosis and calcifications; LEFT breast biopsy upper outer quadrant: DCIS with necrosis and calcifications, ER 100%, PR 80% Right lumpectomy 02/23/2015: ALH, LCIS, 0/1 lymph node Pathological staging: T1a N0 stage IA Adjuvant radiation therapy completed 05/25/2015  Current treatment: Adjuvant anastrozole 1 mg daily 5 years  Return to clinic in 3 months for toxicity check and follow   No orders of the defined types were placed in this encounter.   The patient has a good understanding of the overall plan. she agrees with it. she will call with any problems  that may develop before the next visit here.   Rulon Eisenmenger, MD 06/22/2015

## 2015-06-29 ENCOUNTER — Ambulatory Visit: Payer: Self-pay | Admitting: Radiation Oncology

## 2015-06-30 ENCOUNTER — Encounter: Payer: Self-pay | Admitting: Oncology

## 2015-07-05 ENCOUNTER — Encounter: Payer: Self-pay | Admitting: Radiation Oncology

## 2015-07-05 ENCOUNTER — Ambulatory Visit: Payer: Medicare Other | Admitting: Radiation Oncology

## 2015-07-05 ENCOUNTER — Ambulatory Visit
Admission: RE | Admit: 2015-07-05 | Discharge: 2015-07-05 | Disposition: A | Discharge status: Home / Self Care | Payer: Medicare Other | Source: Ambulatory Visit | Attending: Radiation Oncology | Admitting: Radiation Oncology

## 2015-07-05 VITALS — BP 125/72 | HR 85 | Temp 98.2°F | Ht 62.0 in | Wt 193.3 lb

## 2015-07-05 DIAGNOSIS — C50912 Malignant neoplasm of unspecified site of left female breast: Secondary | ICD-10-CM | POA: Diagnosis not present

## 2015-07-05 DIAGNOSIS — Z170 Estrogen receptor positive status [ER+]: Secondary | ICD-10-CM | POA: Diagnosis not present

## 2015-07-05 DIAGNOSIS — C50911 Malignant neoplasm of unspecified site of right female breast: Secondary | ICD-10-CM

## 2015-07-05 NOTE — Addendum Note (Signed)
Encounter addended by: Benn Moulder, RN on: 07/05/2015  6:19 PM<BR>     Documentation filed: Charges VN

## 2015-07-05 NOTE — Progress Notes (Signed)
  Radiation Oncology         [phone] ________________________________  Name: Brianna Jones MRN: [id number]  Date: 07/05/2015  DOB: Sep 02, 1950  Follow-Up Visit Note  CC: Maximino Greenland, MD  Alphonsa Overall, MD  No diagnosis found.  Diagnosis: Right Breast: stage T1a N0 Invasive Lobular and LCIS (ER 95%, PR 0%) Left Breast: Tis N0 stage 0 DCIS with calcifications (ER 100%, PR 80%,)  Interval Since Last Radiation:  6 weeks. Completed radiation 04/17/2015-05/25/2015 to the right breast treated to 50.4 Gy in 28 fractions of 1.8 Gy and to the left breast treated to 50.4 Gy in 28 fractions at 1.8 Gy.  Narrative:  The patient returns today for routine follow-up.  Ms. Hibbitts here for reassessment S/P XRT to her right and left breast. She reports occasional shooting pains and reports continued fatigue. She had these before radiation, they went away during radiation, and the shooting pains are back. Note diffuse hyperpigmentation of her breast, and in the inframmary fold with intact skin. Note mild edema in the right breast. She is currently working 8 hours daily and admits to having fatigue.   ALLERGIES:  has No Known Allergies.  Meds: Current Outpatient Prescriptions  Medication Sig Dispense Refill  . amLODipine (NORVASC) 10 MG tablet Take 10 mg by mouth daily.    Marland Kitchen anastrozole (ARIMIDEX) 1 MG tablet Take 1 tablet (1 mg total) by mouth daily. 90 tablet 3  . Cholecalciferol (VITAMIN D3) 5000 UNITS CAPS Take 1 capsule by mouth daily. Reported on 03/15/2015    . gabapentin (NEURONTIN) 300 MG capsule Take 300-600 mg by mouth 2 (two) times daily. 1 capsule in the morning, 2 capsules at bedtime    . omega-3 acid ethyl esters (LOVAZA) 1 g capsule Take by mouth 2 (two) times daily.    . traMADol (ULTRAM) 50 MG tablet Take 1 tablet (50 mg total) by mouth every 6 (six) hours as needed for moderate pain. 30 tablet 0  . valsartan-hydrochlorothiazide (DIOVAN-HCT) 160-25 MG tablet Take 1 tablet by  mouth daily.     No current facility-administered medications for this encounter.    Physical Findings: The patient is in no acute distress. Patient is alert and oriented.  height is 5\' 2"  (1.575 m) and weight is 193 lb 4.8 oz (87.68 kg). Her temperature is 98.2 F (36.8 C). Her blood pressure is 125/72 and her pulse is 85. .  No significant changes. The lungs are clear to auscultation. The heart has a regular rhythm and rate. No palpable subclavicular or axillary adenopathy. She has some hyperpigmentation changes throughout both breasts. Some mild to moderate edema. No dominant mass.  Lab Findings: Lab Results  Component Value Date   WBC 7.7 02/15/2015   HGB 13.2 02/15/2015   HCT 40.0 02/15/2015   MCV 88.7 02/15/2015   PLT 185 02/15/2015    Radiographic Findings: No results found.  Impression:  The patient is recovering from the effects of radiation.  Plan:  She will follow up with me in six months.  ____________________________________  Blair Promise, PhD, MD    This document serves as a record of services personally performed by Gery Pray, MD. It was created on his behalf by Lendon Collar, a trained medical scribe. The creation of this record is based on the scribe's personal observations and the provider's statements to them. This document has been checked and approved by the attending provider.

## 2015-07-05 NOTE — Progress Notes (Addendum)
Brianna Jones here for reassessment S/P XRT to her right and left breast.  She reports occasional shooting pains and reports continued fatigue. Note diffuse hyperpigmentation of her breast, and in the inframmary fold with intact skin.  Note mild edema in the right breast.  She is currently working 8 hours daily and admits to having fatigue. Arimidex started  1-2 weeks ago.

## 2015-07-13 DIAGNOSIS — Z9013 Acquired absence of bilateral breasts and nipples: Secondary | ICD-10-CM | POA: Diagnosis not present

## 2015-07-13 DIAGNOSIS — Z853 Personal history of malignant neoplasm of breast: Secondary | ICD-10-CM | POA: Diagnosis not present

## 2015-07-13 DIAGNOSIS — Z923 Personal history of irradiation: Secondary | ICD-10-CM | POA: Diagnosis not present

## 2015-08-10 DIAGNOSIS — C50911 Malignant neoplasm of unspecified site of right female breast: Secondary | ICD-10-CM | POA: Diagnosis not present

## 2015-08-10 DIAGNOSIS — D0592 Unspecified type of carcinoma in situ of left breast: Secondary | ICD-10-CM | POA: Diagnosis not present

## 2015-08-15 DIAGNOSIS — M4806 Spinal stenosis, lumbar region: Secondary | ICD-10-CM | POA: Diagnosis not present

## 2015-08-25 ENCOUNTER — Encounter: Payer: Medicare Other | Admitting: Nurse Practitioner

## 2015-09-06 ENCOUNTER — Encounter: Payer: Medicare Other | Admitting: Adult Health

## 2015-09-13 DIAGNOSIS — E559 Vitamin D deficiency, unspecified: Secondary | ICD-10-CM | POA: Diagnosis not present

## 2015-09-13 DIAGNOSIS — Z0000 Encounter for general adult medical examination without abnormal findings: Secondary | ICD-10-CM | POA: Diagnosis not present

## 2015-09-13 DIAGNOSIS — I10 Essential (primary) hypertension: Secondary | ICD-10-CM | POA: Diagnosis not present

## 2015-09-13 DIAGNOSIS — R7309 Other abnormal glucose: Secondary | ICD-10-CM | POA: Diagnosis not present

## 2015-09-18 ENCOUNTER — Telehealth: Payer: Self-pay | Admitting: Hematology and Oncology

## 2015-09-18 ENCOUNTER — Ambulatory Visit (HOSPITAL_BASED_OUTPATIENT_CLINIC_OR_DEPARTMENT_OTHER): Payer: Medicare Other | Admitting: Hematology and Oncology

## 2015-09-18 ENCOUNTER — Encounter: Payer: Self-pay | Admitting: Hematology and Oncology

## 2015-09-18 DIAGNOSIS — C50412 Malignant neoplasm of upper-outer quadrant of left female breast: Secondary | ICD-10-CM | POA: Diagnosis not present

## 2015-09-18 DIAGNOSIS — C50211 Malignant neoplasm of upper-inner quadrant of right female breast: Secondary | ICD-10-CM

## 2015-09-18 NOTE — Telephone Encounter (Signed)
appt made and avs printed °

## 2015-09-18 NOTE — Assessment & Plan Note (Signed)
RIGHT breast biopsy: 12:30: Invasive lobular carcinoma grade 1, ER 95%, PR 0% insufficient for her to our Ki-67; LCIS with necrosis and calcifications; LEFT breast biopsy upper outer quadrant: DCIS with necrosis and calcifications, ER 100%, PR 80% Right lumpectomy 02/23/2015: ALH, LCIS, 0/1 lymph node Pathological staging: T1a N0 stage IA Adjuvant radiation therapy completed 05/25/2015  Current treatment: Adjuvant anastrozole 1 mg daily 5 years started 06/24/2015  Return to clinic in 6 months for toxicity check and follow up

## 2015-09-18 NOTE — Progress Notes (Signed)
Patient Care Team: Glendale Chard, MD as PCP - General (Internal Medicine) Alphonsa Overall, MD as Consulting Physician (General Surgery) Nicholas Lose, MD as Consulting Physician (Hematology and Oncology) Gery Pray, MD as Consulting Physician (Radiation Oncology) Sylvan Cheese, NP as Nurse Practitioner (Hematology and Oncology) Irene Limbo, MD as Consulting Physician (Plastic Surgery)  DIAGNOSIS: Breast cancer of upper-inner quadrant of right female breast [facility])   Staging form: Breast, AJCC 7th Edition   - Clinical stage from 01/18/2015: Stage IA (T1c, N0, M0) - Unsigned  Breast cancer of upper-outer quadrant of left female breast [facility])   Staging form: Breast, AJCC 7th Edition   - Clinical stage from 01/18/2015: Stage 0 (Tis (DCIS), N0, M0) - Unsigned  SUMMARY OF ONCOLOGIC HISTORY:   Breast cancer of upper-inner quadrant of right female breast ([place])   12/30/2014 Mammogram    Right breast mass at 12:30: 1 x 0.6 x 1.1 cm; left breast calcifications spanning 4 cm with a titer group measuring 5 mm, no pathologic lymph nodes      01/06/2015 Initial Diagnosis    RIGHT breast biopsy: 12:30: Invasive lobular carcinoma grade 1, ER 95%, PR 0% insufficient for her to our Ki-67; LCIS with necrosis and calcifications; LEFT breast biopsy upper outer quadrant: DCIS with necrosis and calcifications, ER 100%, PR 80%      02/23/2015 Surgery    Right lumpectomy: ALH, LCIS, 0/1 lymph node      04/17/2015 - 05/25/2015 Radiation Therapy    Adjuvant radiation therapy      06/22/2015 -  Anti-estrogen oral therapy    Anastrozole 1 mg daily       Breast cancer of upper-outer quadrant of left female breast ([place])   02/23/2015 Surgery    Left lumpectomy: DCIS with calcifications, 4 mm margin, ER 95%, PR 0%, HER-2 insufficient, Tis N0 stage 0      04/17/2015 - 05/25/2015 Radiation Therapy    Adjuvant radiation therapy      06/22/2015 -  Anti-estrogen oral therapy    Anastrozole 1 mg  daily       CHIEF COMPLIANT:   INTERVAL HISTORY: Irwin Brakeman is a     REVIEW OF SYSTEMS:   Constitutional: Denies fevers, chills or abnormal weight loss Eyes: Denies blurriness of vision Ears, nose, mouth, throat, and face: Denies mucositis or sore throat Respiratory: Denies cough, dyspnea or wheezes Cardiovascular: Denies palpitation, chest discomfort Gastrointestinal:  Denies nausea, heartburn or change in bowel habits Skin: Denies abnormal skin rashes Lymphatics: Denies new lymphadenopathy or easy bruising Neurological:Denies numbness, tingling or new weaknesses Behavioral/Psych: Mood is stable, no new changes  Extremities: No lower extremity edema Breast:  Itching beneath bilateral breasts All other systems were reviewed with the patient and are negative.  I have reviewed the past medical history, past surgical history, social history and family history with the patient and they are unchanged from previous note.  ALLERGIES:  has No Known Allergies.  MEDICATIONS:  Current Outpatient Prescriptions  Medication Sig Dispense Refill  . amLODipine (NORVASC) 10 MG tablet Take 10 mg by mouth daily.    Marland Kitchen anastrozole (ARIMIDEX) 1 MG tablet Take 1 tablet (1 mg total) by mouth daily. 90 tablet 3  . Cholecalciferol (VITAMIN D3) 5000 UNITS CAPS Take 1 capsule by mouth daily. Reported on 03/15/2015    . gabapentin (NEURONTIN) 300 MG capsule Take 300-600 mg by mouth 2 (two) times daily. 1 capsule in the morning, 2 capsules at bedtime    . omega-3 acid  ethyl esters (LOVAZA) 1 g capsule Take by mouth 2 (two) times daily.    . traMADol (ULTRAM) 50 MG tablet Take 1 tablet (50 mg total) by mouth every 6 (six) hours as needed for moderate pain. 30 tablet 0  . valsartan-hydrochlorothiazide (DIOVAN-HCT) 160-25 MG tablet Take 1 tablet by mouth daily.     No current facility-administered medications for this visit.     PHYSICAL EXAMINATION: ECOG PERFORMANCE STATUS: 1 - Symptomatic but  completely ambulatory  Vitals:   09/18/15 1533  BP: 127/80  Pulse: 88  Resp: 18  Temp: 98.1 F (36.7 C)   Filed Weights   09/18/15 1533  Weight: 187 lb 9.6 oz (85.1 kg)    GENERAL:alert, no distress and comfortable SKIN: skin color, texture, turgor are normal, no rashes or significant lesions EYES: normal, Conjunctiva are pink and non-injected, sclera clear OROPHARYNX:no exudate, no erythema and lips, buccal mucosa, and tongue normal  NECK: supple, thyroid normal size, non-tender, without nodularity LYMPH:  no palpable lymphadenopathy in the cervical, axillary or inguinal LUNGS: clear to auscultation and percussion with normal breathing effort HEART: regular rate & rhythm and no murmurs and no lower extremity edema ABDOMEN:abdomen soft, non-tender and normal bowel sounds MUSCULOSKELETAL:no cyanosis of digits and no clubbing  NEURO: alert & oriented x 3 with fluent speech, no focal motor/sensory deficits EXTREMITIES: No lower extremity edema BREAST: No palpable masses or nodules in either right or left breasts. No palpable axillary supraclavicular or infraclavicular adenopathy no breast tenderness or nipple discharge. (exam performed in the presence of a chaperone)  LABORATORY DATA:  I have reviewed the data as listed   Chemistry      Component Value Date/Time   NA 140 02/15/2015 1433   NA 140 01/18/2015 0827   K 3.9 02/15/2015 1433   K 3.6 01/18/2015 0827   CL 102 02/15/2015 1433   CO2 29 02/15/2015 1433   CO2 28 01/18/2015 0827   BUN 15 02/15/2015 1433   BUN 11.8 01/18/2015 0827   CREATININE 0.84 02/15/2015 1433   CREATININE 0.9 01/18/2015 0827      Component Value Date/Time   CALCIUM 10.3 02/15/2015 1433   CALCIUM 10.0 01/18/2015 0827   ALKPHOS 63 02/15/2015 1433   ALKPHOS 75 01/18/2015 0827   AST 25 02/15/2015 1433   AST 25 01/18/2015 0827   ALT 27 02/15/2015 1433   ALT 22 01/18/2015 0827   BILITOT 0.5 02/15/2015 1433   BILITOT 0.35 01/18/2015 0827        Lab Results  Component Value Date   WBC 7.7 02/15/2015   HGB 13.2 02/15/2015   HCT 40.0 02/15/2015   MCV 88.7 02/15/2015   PLT 185 02/15/2015   NEUTROABS 5.1 02/15/2015     ASSESSMENT & PLAN:  Breast cancer of upper-outer quadrant of left female breast ([place]) Left lumpectomy: DCIS with calcifications, 4 mm margin, ER 95%, PR 0%, HER-2 insufficient,  Pathological stage: Tis N0 stage 0 Adjuvant radiation therapy completed 05/25/2015 Treatment plan: Adjuvant anastrozole 1 mg daily 5 years started 06/22/2015  Breast cancer of upper-inner quadrant of right female breast ([place]) RIGHT breast biopsy: 12:30: Invasive lobular carcinoma grade 1, ER 95%, PR 0% insufficient for her to our Ki-67; LCIS with necrosis and calcifications; LEFT breast biopsy upper outer quadrant: DCIS with necrosis and calcifications, ER 100%, PR 80% Right lumpectomy 02/23/2015: ALH, LCIS, 0/1 lymph node Pathological staging: T1a N0 stage IA Adjuvant radiation therapy completed 05/25/2015  Current treatment: Adjuvant anastrozole 1 mg daily 5  years started 06/24/2015 Anastrozole toxicities: 1. Intermittent hot flashes 2. musculoskeletal stiffness The side effects are tolerable.  Return to clinic in 6 months for toxicity check and follow up   No orders of the defined types were placed in this encounter.  The patient has a good understanding of the overall plan. she agrees with it. she will call with any problems that may develop before the next visit here.   Rulon Eisenmenger, MD 09/18/15

## 2015-09-18 NOTE — Assessment & Plan Note (Signed)
Left lumpectomy: DCIS with calcifications, 4 mm margin, ER 95%, PR 0%, HER-2 insufficient,  Pathological stage: Tis N0 stage 0 Adjuvant radiation therapy completed 05/25/2015 Treatment plan: Adjuvant anastrozole 1 mg daily 5 years started 06/22/2015  Anastrozole toxicities:

## 2015-09-22 ENCOUNTER — Telehealth: Payer: Self-pay | Admitting: Adult Health

## 2015-09-22 ENCOUNTER — Encounter: Payer: Self-pay | Admitting: Adult Health

## 2015-09-22 ENCOUNTER — Ambulatory Visit (HOSPITAL_BASED_OUTPATIENT_CLINIC_OR_DEPARTMENT_OTHER): Payer: Medicare Other | Admitting: Adult Health

## 2015-09-22 VITALS — BP 123/79 | HR 89 | Temp 98.4°F | Resp 18 | Ht 62.0 in | Wt 186.7 lb

## 2015-09-22 DIAGNOSIS — D0512 Intraductal carcinoma in situ of left breast: Secondary | ICD-10-CM

## 2015-09-22 DIAGNOSIS — C50911 Malignant neoplasm of unspecified site of right female breast: Secondary | ICD-10-CM

## 2015-09-22 DIAGNOSIS — C50211 Malignant neoplasm of upper-inner quadrant of right female breast: Secondary | ICD-10-CM | POA: Diagnosis not present

## 2015-09-22 DIAGNOSIS — C50912 Malignant neoplasm of unspecified site of left female breast: Principal | ICD-10-CM

## 2015-09-22 NOTE — Telephone Encounter (Signed)
appt made and avs printed °

## 2015-09-22 NOTE — Progress Notes (Signed)
CLINIC:  Survivorship   REASON FOR VISIT:  Routine follow-up post-treatment for a recent history of breast cancer.  BRIEF ONCOLOGIC HISTORY:    Breast cancer of upper-inner quadrant of right female breast ([place])   12/30/2014 Mammogram    Right breast mass at 12:30: 1 x 0.6 x 1.1 cm; left breast calcifications spanning 4 cm with a titer group measuring 5 mm, no pathologic lymph nodes      01/06/2015 Initial Diagnosis    RIGHT breast biopsy: 12:30: Invasive lobular carcinoma grade 1, ER 95%, PR 0%, insufficient for HER2 or Ki-67; LCIS with necrosis and calcifications; LEFT breast biopsy upper outer quadrant: DCIS with necrosis and calcifications, ER 100%, PR 80%      02/23/2015 Surgery    Right lumpectomy: ALH, LCIS, 0/1 lymph node      04/17/2015 - 05/25/2015 Radiation Therapy    Adjuvant radiation therapy ([place]): Right breast was treated to 50.4 Gy in 28 fractions at 1.8 Gy per fraction.  Left breast was treated to 50.4 Gy in 28 fractions at 1.8 Gy per fraction.      06/22/2015 -  Anti-estrogen oral therapy    Anastrozole 1 mg daily       Breast cancer of upper-outer quadrant of left female breast ([place])   02/23/2015 Surgery    Left lumpectomy [name]): DCIS with calcifications, 4 mm margin, ER 95%, PR 80%, Tis N0 stage 0      04/17/2015 - 05/25/2015 Radiation Therapy    Adjuvant radiation therapy ([place]): Right breast was treated to 50.4 Gy in 28 fractions at 1.8 Gy per fraction.  Left breast was treated to 50.4 Gy in 28 fractions at 1.8 Gy per fraction.      06/22/2015 -  Anti-estrogen oral therapy    Anastrozole 1 mg daily       INTERVAL HISTORY:  Brianna Jones presents to the [facility] today for our initial meeting to review her survivorship care plan detailing her treatment course for breast cancer, as well as monitoring long-term side effects of that treatment, education regarding health maintenance, screening, and overall wellness and health promotion.      Overall, Brianna Jones reports feeling quite well since completing her radiation therapy approximately 4 months ago.  She started the anastrazole about 3 months ago and is tolerating it well.  Her joint pain (particularly in her knees) is definitely worse since starting the anstrazole, but it is manageable with her current regimen of Biofreeze and Meloxicam during the day, and she will take 1 Tramadol at bedtime.  She denies any vaginal dryness and hot flashes.  She actually reports having "cold flashes" and feeling cold more often than not.   She has some itching to her bilateral breasts, which is worse when she wears a bra; her axillae also itch-currently the (L) is worse than the right.  She has recently changed deodorants and that has helped some.  She applies Vaseline to her breast which helps soothe them.   She continues to have some fatigue since finishing treatment, which is improving. She is walking about 3 days/week. Unfortunately, she did fall recently at work and sprained her ankle; she tells me that she lost her footing (did not lose consciousness, faint, etc).    REVIEW OF SYSTEMS:  Review of Systems  Constitutional: Positive for malaise/fatigue.       (+) fatigue  HENT: Negative.   Eyes: Negative.   Respiratory: Negative.   Cardiovascular: Negative.   Gastrointestinal: Negative.  Genitourinary: Negative.   Musculoskeletal: Positive for falls and joint pain.  Skin: Positive for itching.  Neurological: Negative.   Endo/Heme/Allergies:       Some cold intolerance, "cold flashes"  Psychiatric/Behavioral: Negative.   GU: Denies vaginal bleeding, discharge, or dryness.  Breast: Denies any new nodularity, masses, nipple changes, or nipple discharge.    A 14-point review of systems was completed and was negative, except as noted above.   ONCOLOGY TREATMENT TEAM:  1. Surgeon:  Dr. [name] at [facility] 2. Medical Oncologist: Dr. Lindi Adie 3. Radiation Oncologist:  Dr. Sondra Come    PAST MEDICAL/SURGICAL HISTORY:  Past Medical History:  Diagnosis Date  . Arthritis   . Breast cancer of upper-inner quadrant of right female breast ([place]) 01/11/2015  . Hypertension   . Neuropathy ([place])    rt foot  . Radiation 04/17/15-05/25/15   right breast 50.4 Gy, left breast 50.4 Gy   Past Surgical History:  Procedure Laterality Date  . BREAST LUMPECTOMY WITH NEEDLE LOCALIZATION Left 02/23/2015   Procedure: LEFT BREAST LUMPECTOMY WITH TWO NEEDLE LOCALIZATION BRACKET;  Surgeon: Alphonsa Overall, MD;  Location: [place];  Service: General;  Laterality: Left;  . BREAST LUMPECTOMY WITH RADIOACTIVE SEED AND SENTINEL LYMPH NODE BIOPSY Bilateral 02/23/2015  . BREAST LUMPECTOMY WITH RADIOACTIVE SEED AND SENTINEL LYMPH NODE BIOPSY Right 02/23/2015   Procedure: RIGHT BREAST LUMPECTOMY WITH RADIOACTIVE SEED AND RIGHT AXILLARY SENTINEL LYMPH NODE BIOPSY;  Surgeon: Alphonsa Overall, MD;  Location: [place];  Service: General;  Laterality: Right;  . BREAST REDUCTION SURGERY Bilateral 03/03/2015   Procedure: BILATERAL ONCOPLASTIC RECONSTRUCTION BY BREAST REDUCTION ;  Surgeon: Irene Limbo, MD;  Location: [place];  Service: Plastics;  Laterality: Bilateral;  . FOOT SURGERY     RT FOOT BONE SPUR  . SHOULDER SURGERY Left      ALLERGIES:  No Known Allergies   CURRENT MEDICATIONS:  Outpatient Encounter Prescriptions as of 09/22/2015  Medication Sig Note  . amLODipine (NORVASC) 10 MG tablet Take 10 mg by mouth daily.   Marland Kitchen anastrozole (ARIMIDEX) 1 MG tablet Take 1 tablet (1 mg total) by mouth daily.   . Cholecalciferol (VITAMIN D3) 5000 UNITS CAPS Take 1 capsule by mouth daily. Reported on 03/15/2015   . gabapentin (NEURONTIN) 300 MG capsule Take 300-600 mg by mouth 2 (two) times daily. 1 capsule in the morning, 2 capsules at bedtime   . meloxicam (MOBIC) 15 MG tablet Take 15 mg by mouth daily.   Marland Kitchen omega-3 acid ethyl esters (LOVAZA) 1 g capsule Take by mouth 2 (two) times daily.    . traMADol (ULTRAM) 50 MG tablet Take 1 tablet (50 mg total) by mouth every 6 (six) hours as needed for moderate pain.   . valsartan-hydrochlorothiazide (DIOVAN-HCT) 160-25 MG tablet Take 1 tablet by mouth daily.   . [DISCONTINUED] anastrozole (ARIMIDEX) 1 MG tablet Take by mouth. 09/22/2015: Received from: [facility] Received Sig: Take by mouth.  . [DISCONTINUED] traMADol (ULTRAM) 50 MG tablet Take by mouth. 09/22/2015: Received from: [facility] Received Sig: Take by mouth.   No facility-administered encounter medications on file as of 09/22/2015.      ONCOLOGIC FAMILY HISTORY:  Family History  Problem Relation Age of Onset  . Diabetes Mother   . Heart disease Mother   . Hyperlipidemia Mother   . Hypertension Mother   . Heart disease Father   . Hypertension Father   . Diabetes Brother   . Lung cancer  Maternal Grandmother      GENETIC COUNSELING/TESTING: None.  SOCIAL HISTORY:  Brianna Jones is divorced and lives in [place], [place].  She has 2 children, 1 son who lives in [place] and works in Scientist, research (medical) and 1 daughter who lives in [place] and works as a Holiday representative.  She has 1 grandson who is 62 years old (will be 33 years old in October).  She denies any current or history of tobacco or illicit drug use.  She drinks alcohol occasionally.    PHYSICAL EXAMINATION:  Vital Signs:   Vitals:   09/22/15 1405  BP: 123/79  Pulse: 89  Resp: 18  Temp: 98.4 F (36.9 C)   Filed Weights   09/22/15 1405  Weight: 186 lb 11.2 oz (84.7 kg)   General: Well-nourished, well-appearing female in no acute distress.  She is unaccompanied today.   HEENT: Head is normocephalic.  Pupils equal and reactive to light and accomodation. Conjunctivae clear without exudate.  Sclerae anicteric. Oral mucosa is pink, moist.  Oropharynx is pink without lesions or erythema.  Lymph: No cervical, supraclavicular, or infraclavicular lymphadenopathy  noted on palpation.  Cardiovascular: Regular rate and rhythm.Marland Kitchen Respiratory: Clear to auscultation bilaterally. Chest expansion symmetric; breathing non-labored.  Breasts: Bilateral breasts hyperpigmented and mildly erythematous. There is some edema bilaterally as well.   GI: Abdomen soft and round; non-tender, non-distended. Bowel sounds normoactive.    GU: Deferred.  Neuro: No focal deficits. Steady gait.  Psych: Mood and affect normal and appropriate for situation.  Extremities: No edema. Skin: Warm and dry.  LABORATORY DATA:  None for this visit.  DIAGNOSTIC IMAGING:  None for this visit.      ASSESSMENT AND PLAN:  Brianna Jones is a pleasant 65 y.o. female with bilateral breast cancer; Stage IA right breast invasive lobular carcinoma, ER+/PR-/(insufficient tumor for HER2 testing) and Stage 0 left breast DCIS, both diagnosed in 12/2014, treated with bilateral lumpectomies, adjuvant radiation therapy, and anti-estrogen therapy with anastrazole beginning in 05/2015.  She presents to the Survivorship Clinic for our initial meeting and routine follow-up post-completion of treatment for breast cancer.    1. Bilateral breast cancer; Stage IA (right) & Stage 0 (left):  Brianna Jones is continuing to recover from definitive treatment for breast cancer. Her 1st post-treatment mammogram will be due in 12/2015; orders placed today.  She will follow-up with her medical oncologist, Dr. Lindi Adie in 02/2016 with history and physical exam per surveillance protocol.  She will continue her anti-estrogen therapy with Anastrazole. Thus far, she is tolerating the anastrazole well, with minimal side effects. She was instructed to make Dr. Lindi Adie or myself aware if she begins to experience any worsening side effects of the medication and I could see her back in clinic to help manage those side effects, as needed. Common side effects of Tamoxifen were again reviewed with her as well. Today, a comprehensive  survivorship care plan and treatment summary was reviewed with the patient today detailing her breast cancer diagnosis, treatment course, potential late/long-term effects of treatment, appropriate follow-up care with recommendations for the future, and patient education resources.  A copy of this summary, along with a letter will be sent to the patient's primary care provider via mail/fax/In Basket message after today's visit.    2. Post-radiation skin care: I think she may have had some contact dermatitis, coupled with dry skin on her breasts and axilla.  I encouraged her to use vitamin E oil to the breasts to provide additional moisture  to prevent dryness and associated pruritis.  We discussed that fungal infections in the folds of the breasts and sometimes in the axilla are common, particularly in the summer time.  However, there was not evidence of any fungal skin infections on exam today.  I think she is continuing to heal from radiation and is still experiencing some post-radiation skin changes.  I stressed the importance of adequate moisturizing agents as she continues to heal.  She voiced understanding and agreed with this plan.    3. Fatigue: This is likely multifactorial and will continue to improve.  I encouraged her to continue to be physically active with her walking regimen, as she is able to tolerate.  Obviously she should use caution given her recent sprained ankle and fall, but she tells me this is healing and she is doing better.  If the fatigue persists, we discussed that this may be related to possible thyroid dysfunction.  The fatigue coupled with her reported cold intolerance could suggest hypothyroidism as a potential etiology.  I encouraged her to contact her PCP's office and to talk to them about considering collecting a TSH if her symptoms do not improve.   4. Bone health:  Given Brianna Jones age, history of breast cancer, and her current treatment regimen including anti-estrogen  therapy with anastrazole, she is at risk for bone demineralization.  Her last DEXA scan was 01/13/15 and showed ? osteopenia (report difficult to read).  She will likely be due in 1-2 years with additional DEXA imaging, as clinically indicated.  In the meantime, she was encouraged to increase her consumption of foods rich in calcium, as well as increase her weight-bearing activities.  She was given education on specific activities to promote bone health.  5. Cancer screening:  Due to Brianna Jones history and her age, she should receive screening for skin cancers, colon cancer, and gynecologic cancers.  The information and recommendations are listed on the patient's comprehensive care plan/treatment summary and were reviewed in detail with the patient.    6. Health maintenance and wellness promotion: Brianna Jones was encouraged to consume 5-7 servings of fruits and vegetables per day. We reviewed the "Nutrition Rainbow" handout, as well as the handout "Take Control of Your Health and Reduce Your Cancer Risk" from the [employer].  She was also encouraged to engage in moderate to vigorous exercise for 30 minutes per day most days of the week. We discussed the [employer] fitness program, which is designed for cancer survivors to help them become more physically fit after cancer treatments.  She was instructed to limit her alcohol consumption and continue to abstain from tobacco use.    7. Support services/counseling: It is not uncommon for this period of the patient's cancer care trajectory to be one of many emotions and stressors.  We discussed an opportunity for her to participate in the next session of [facility] ("Finding Your New Normal") support group series designed for patients after they have completed treatment.   Brianna Jones was encouraged to take advantage of our many other support services programs, support groups, and/or counseling in coping with her new life as a cancer survivor  after completing anti-cancer treatment.  She was offered support today through active listening and expressive supportive counseling.  She was given information regarding our available services and encouraged to contact me with any questions or for help enrolling in any of our support group/programs.    Dispo:   -Post-treatment mammogram due 12/2015; orders placed  today.  -Return to cancer center to see Dr. Lindi Adie in 02/2016  -She is welcome to return back to the Survivorship Clinic at any time; no additional follow-up needed at this time.  -Consider referral back to survivorship as a long-term survivor for continued surveillance  A total of 60 minutes of face-to-face time was spent with this patient with greater than 50% of that time in counseling and care-coordination.   Mike Craze, NP Survivorship Program [place] [phone]   Note: PRIMARY CARE PROVIDER Maximino Greenland, MD [phone]

## 2015-10-09 ENCOUNTER — Other Ambulatory Visit: Payer: Self-pay | Admitting: Occupational Medicine

## 2015-10-09 ENCOUNTER — Ambulatory Visit: Payer: Self-pay

## 2015-10-09 DIAGNOSIS — M25532 Pain in left wrist: Secondary | ICD-10-CM

## 2015-10-19 DIAGNOSIS — M79642 Pain in left hand: Secondary | ICD-10-CM | POA: Diagnosis not present

## 2015-11-09 DIAGNOSIS — C50919 Malignant neoplasm of unspecified site of unspecified female breast: Secondary | ICD-10-CM | POA: Diagnosis not present

## 2015-11-09 DIAGNOSIS — R05 Cough: Secondary | ICD-10-CM | POA: Diagnosis not present

## 2015-11-20 DIAGNOSIS — M1812 Unilateral primary osteoarthritis of first carpometacarpal joint, left hand: Secondary | ICD-10-CM | POA: Diagnosis not present

## 2015-11-20 DIAGNOSIS — G5602 Carpal tunnel syndrome, left upper limb: Secondary | ICD-10-CM | POA: Diagnosis not present

## 2015-11-21 DIAGNOSIS — M47816 Spondylosis without myelopathy or radiculopathy, lumbar region: Secondary | ICD-10-CM | POA: Diagnosis not present

## 2015-12-04 DIAGNOSIS — M1812 Unilateral primary osteoarthritis of first carpometacarpal joint, left hand: Secondary | ICD-10-CM | POA: Diagnosis not present

## 2015-12-04 DIAGNOSIS — G5602 Carpal tunnel syndrome, left upper limb: Secondary | ICD-10-CM | POA: Diagnosis not present

## 2015-12-25 ENCOUNTER — Ambulatory Visit
Admission: RE | Admit: 2015-12-25 | Discharge: 2015-12-25 | Disposition: A | Discharge status: Home / Self Care | Payer: Medicare Other | Source: Ambulatory Visit | Attending: Adult Health | Admitting: Adult Health

## 2015-12-25 DIAGNOSIS — C50912 Malignant neoplasm of unspecified site of left female breast: Principal | ICD-10-CM

## 2015-12-25 DIAGNOSIS — R928 Other abnormal and inconclusive findings on diagnostic imaging of breast: Secondary | ICD-10-CM | POA: Diagnosis not present

## 2015-12-25 DIAGNOSIS — C50911 Malignant neoplasm of unspecified site of right female breast: Secondary | ICD-10-CM

## 2016-01-04 DIAGNOSIS — M1812 Unilateral primary osteoarthritis of first carpometacarpal joint, left hand: Secondary | ICD-10-CM | POA: Diagnosis not present

## 2016-01-04 DIAGNOSIS — G5602 Carpal tunnel syndrome, left upper limb: Secondary | ICD-10-CM | POA: Diagnosis not present

## 2016-01-11 DIAGNOSIS — Z9889 Other specified postprocedural states: Secondary | ICD-10-CM | POA: Diagnosis not present

## 2016-01-11 DIAGNOSIS — Z923 Personal history of irradiation: Secondary | ICD-10-CM | POA: Diagnosis not present

## 2016-01-11 DIAGNOSIS — Z853 Personal history of malignant neoplasm of breast: Secondary | ICD-10-CM | POA: Diagnosis not present

## 2016-01-18 ENCOUNTER — Ambulatory Visit: Payer: Medicare Other | Admitting: Radiation Oncology

## 2016-02-14 ENCOUNTER — Ambulatory Visit: Admission: RE | Admit: 2016-02-14 | Payer: Medicare Other | Source: Ambulatory Visit | Admitting: Radiation Oncology

## 2016-02-14 ENCOUNTER — Telehealth: Payer: Self-pay | Admitting: Oncology

## 2016-02-14 NOTE — Telephone Encounter (Signed)
Left a message to reschedule Brianna Jones's follow up appointment this afternoon.  Requested a return call.

## 2016-02-22 ENCOUNTER — Encounter: Payer: Self-pay | Admitting: Radiation Oncology

## 2016-02-22 ENCOUNTER — Ambulatory Visit
Admission: RE | Admit: 2016-02-22 | Discharge: 2016-02-22 | Disposition: A | Discharge status: Home / Self Care | Payer: Medicare Other | Source: Ambulatory Visit | Attending: Radiation Oncology | Admitting: Radiation Oncology

## 2016-02-22 VITALS — BP 133/92 | HR 93 | Temp 98.2°F

## 2016-02-22 DIAGNOSIS — C50412 Malignant neoplasm of upper-outer quadrant of left female breast: Secondary | ICD-10-CM | POA: Diagnosis not present

## 2016-02-22 DIAGNOSIS — Z170 Estrogen receptor positive status [ER+]: Secondary | ICD-10-CM | POA: Insufficient documentation

## 2016-02-22 DIAGNOSIS — Z08 Encounter for follow-up examination after completed treatment for malignant neoplasm: Secondary | ICD-10-CM | POA: Diagnosis not present

## 2016-02-22 DIAGNOSIS — Y842 Radiological procedure and radiotherapy as the cause of abnormal reaction of the patient, or of later complication, without mention of misadventure at the time of the procedure: Secondary | ICD-10-CM | POA: Diagnosis not present

## 2016-02-22 DIAGNOSIS — R921 Mammographic calcification found on diagnostic imaging of breast: Secondary | ICD-10-CM | POA: Insufficient documentation

## 2016-02-22 DIAGNOSIS — Z79899 Other long term (current) drug therapy: Secondary | ICD-10-CM | POA: Diagnosis not present

## 2016-02-22 DIAGNOSIS — C50211 Malignant neoplasm of upper-inner quadrant of right female breast: Secondary | ICD-10-CM | POA: Insufficient documentation

## 2016-02-22 DIAGNOSIS — C50912 Malignant neoplasm of unspecified site of left female breast: Secondary | ICD-10-CM | POA: Diagnosis not present

## 2016-02-22 DIAGNOSIS — Z79811 Long term (current) use of aromatase inhibitors: Secondary | ICD-10-CM | POA: Diagnosis not present

## 2016-02-22 DIAGNOSIS — Z923 Personal history of irradiation: Secondary | ICD-10-CM | POA: Diagnosis not present

## 2016-02-22 DIAGNOSIS — C50911 Malignant neoplasm of unspecified site of right female breast: Secondary | ICD-10-CM | POA: Diagnosis not present

## 2016-02-22 NOTE — Progress Notes (Signed)
Brianna Jones is here for follow up after treatment to her right and left breast.  She denies having pain.  She is taking anastrozole.  The skin on both her breasts has slight hyperpigmentation.  She is using vitamin E oil.  She had a mammogram in November 2017.  BP (!) 133/92 (BP Location: Left Arm, Patient Position: Sitting)   Pulse 93   Temp 98.2 F (36.8 C) (Oral)   SpO2 99%    Wt Readings from Last 3 Encounters:  09/22/15 186 lb 11.2 oz (84.7 kg)  09/18/15 187 lb 9.6 oz (85.1 kg)  07/05/15 193 lb 4.8 oz (87.7 kg)

## 2016-02-22 NOTE — Progress Notes (Signed)
Radiation Oncology         ([phone]) [phone] ________________________________  Name: Brianna Jones MRN: [id number]  Date: 02/22/2016  DOB: 1950-02-29  Follow-Up Visit Note  CC: Maximino Greenland, MD  Alphonsa Overall, MD    ICD-9-CM ICD-10-CM   1. Malignant neoplasm of upper-inner quadrant of right breast in female, estrogen receptor positive ([place]) 174.2 C50.211    V86.0 Z17.0   2. Malignant neoplasm of upper-outer quadrant of left breast in female, estrogen receptor positive ([facility]) 174.4 C50.412    V86.0 Z17.0     Diagnosis:  Right Breast: Stage IA T1a N0 Invasive LobularCarcinoma and LCIS (ER 95%, PR 0%) Left Breast: Stage 0 Tis N0 DCIS with calcifications (ER 100%, PR 80%)  Interval Since Last Radiation:  9 months  Completed radiation 04/17/2015-05/25/2015 to the right breast treated to 50.4 Gy in 28 fractions of 1.8 Gy and to the left breast treated to 50.4 Gy in 28 fractions at 1.8 Gy.  Narrative:  The patient returns today for routine follow-up. Bilateral screening mammogram on 12/25/15 was benign. The patient has been on Anastrozole since 05/2015. The patient denies pain. The nurse notes that the patient's bilateral breasts have slight hyperpigmentation and she is using vitamin E oil on them.  ALLERGIES:  has No Known Allergies.  Meds: Current Outpatient Prescriptions  Medication Sig Dispense Refill  . amLODipine (NORVASC) 10 MG tablet Take 10 mg by mouth daily.    Marland Kitchen anastrozole (ARIMIDEX) 1 MG tablet Take 1 tablet (1 mg total) by mouth daily. 90 tablet 3  . Cholecalciferol (VITAMIN D3) 5000 UNITS CAPS Take 1 capsule by mouth daily. Reported on 03/15/2015    . gabapentin (NEURONTIN) 300 MG capsule Take 300-600 mg by mouth 2 (two) times daily. 1 capsule in the morning, 2 capsules at bedtime    . meloxicam (MOBIC) 15 MG tablet Take 15 mg by mouth daily.    . valsartan-hydrochlorothiazide (DIOVAN-HCT) 160-25 MG tablet Take 1 tablet by mouth daily.    Marland Kitchen omega-3 acid ethyl esters  (LOVAZA) 1 g capsule Take by mouth 2 (two) times daily.    . traMADol (ULTRAM) 50 MG tablet Take 1 tablet (50 mg total) by mouth every 6 (six) hours as needed for moderate pain. (Patient not taking: Reported on 02/22/2016) 30 tablet 0   No current facility-administered medications for this encounter.     Physical Findings: The patient is in no acute distress. Patient is alert and oriented.  oral temperature is 98.2 F (36.8 C). Her blood pressure is 133/92 (abnormal) and her pulse is 93. Her oxygen saturation is 99%. . The lungs are clear to auscultation. The heart has a regular rhythm and rate. No palpable subclavicular or axillary adenopathy.  She has some hyperpigmentation changes of both breasts. Reduction scars are well healed. Mild edema in the breasts.  Lab Findings: Lab Results  Component Value Date   WBC 7.7 02/15/2015   HGB 13.2 02/15/2015   HCT 40.0 02/15/2015   MCV 88.7 02/15/2015   PLT 185 02/15/2015    Radiographic Findings: No results found.  Impression:  The patient is recovering from the effects of radiation. No evidence of recurrence on clinical exam.  Plan: PRN follow up with radiation oncology. The patient will continue close follow up with medical oncology. The patient is scheduled to follow up with Dr. Lindi Adie on 03/19/16. ____________________________________  Blair Promise, PhD, MD  This document serves as a record of services personally performed by Gery Pray, MD.  It was created on his behalf by Darcus Austin, a trained medical scribe. The creation of this record is based on the scribe's personal observations and the provider's statements to them. This document has been checked and approved by the attending provider.

## 2016-03-07 DIAGNOSIS — I10 Essential (primary) hypertension: Secondary | ICD-10-CM | POA: Diagnosis not present

## 2016-03-07 DIAGNOSIS — E785 Hyperlipidemia, unspecified: Secondary | ICD-10-CM | POA: Diagnosis not present

## 2016-03-07 DIAGNOSIS — R7309 Other abnormal glucose: Secondary | ICD-10-CM | POA: Diagnosis not present

## 2016-03-19 ENCOUNTER — Encounter: Payer: Self-pay | Admitting: Hematology and Oncology

## 2016-03-19 ENCOUNTER — Ambulatory Visit (HOSPITAL_BASED_OUTPATIENT_CLINIC_OR_DEPARTMENT_OTHER): Payer: Medicare Other | Admitting: Hematology and Oncology

## 2016-03-19 DIAGNOSIS — G5602 Carpal tunnel syndrome, left upper limb: Secondary | ICD-10-CM | POA: Diagnosis not present

## 2016-03-19 DIAGNOSIS — C50211 Malignant neoplasm of upper-inner quadrant of right female breast: Secondary | ICD-10-CM

## 2016-03-19 DIAGNOSIS — Z170 Estrogen receptor positive status [ER+]: Secondary | ICD-10-CM | POA: Diagnosis not present

## 2016-03-19 NOTE — Assessment & Plan Note (Signed)
RIGHT breast biopsy: 12:30: Invasive lobular carcinoma grade 1, ER 95%, PR 0% insufficient for her to our Ki-67; LCIS with necrosis and calcifications; LEFT breast biopsy upper outer quadrant: DCIS with necrosis and calcifications, ER 100%, PR 80% Right lumpectomy 02/23/2015: ALH, LCIS, 0/1 lymph node Pathological staging: T1a N0 stage IA Adjuvant radiation therapy completed 05/25/2015  Current treatment: Adjuvant anastrozole 1 mg daily 5 years started 06/24/2015 Anastrozole toxicities: 1. Intermittent hot flashes 2. musculoskeletal stiffness The side effects are tolerable.  Return to clinic in 6 months for toxicity check and follow up

## 2016-03-19 NOTE — Progress Notes (Signed)
Patient Care Team: Glendale Chard, MD as PCP - General (Internal Medicine) Alphonsa Overall, MD as Consulting Physician (General Surgery) Nicholas Lose, MD as Consulting Physician (Hematology and Oncology) Gery Pray, MD as Consulting Physician (Radiation Oncology) Sylvan Cheese, NP as Nurse Practitioner (Hematology and Oncology) Irene Limbo, MD as Consulting Physician (Plastic Surgery)  DIAGNOSIS:  Encounter Diagnosis  Name Primary?  . Malignant neoplasm of upper-inner quadrant of right breast in female, estrogen receptor positive ([name])     SUMMARY OF ONCOLOGIC HISTORY:   Breast cancer of upper-inner quadrant of right female breast ([place])   12/30/2014 Mammogram    Right breast mass at 12:30: 1 x 0.6 x 1.1 cm; left breast calcifications spanning 4 cm with a titer group measuring 5 mm, no pathologic lymph nodes      01/06/2015 Initial Diagnosis    RIGHT breast biopsy: 12:30: Invasive lobular carcinoma grade 1, ER 95%, PR 0%, insufficient for HER2 or Ki-67; LCIS with necrosis and calcifications; LEFT breast biopsy upper outer quadrant: DCIS with necrosis and calcifications, ER 100%, PR 80%      02/23/2015 Surgery    Right lumpectomy: ALH, LCIS, 0/1 lymph node      04/17/2015 - 05/25/2015 Radiation Therapy    Adjuvant radiation therapy (Kinard): Right breast was treated to 50.4 Gy in 28 fractions at 1.8 Gy per fraction.  Left breast was treated to 50.4 Gy in 28 fractions at 1.8 Gy per fraction.      06/22/2015 -  Anti-estrogen oral therapy    Anastrozole 1 mg daily       Breast cancer of upper-outer quadrant of left female breast ([name])   02/23/2015 Surgery    Left lumpectomy [name]): DCIS with calcifications, 4 mm margin, ER 95%, PR 80%, Tis N0 stage 0      04/17/2015 - 05/25/2015 Radiation Therapy    Adjuvant radiation therapy ([facility]ard): Right breast was treated to 50.4 Gy in 28 fractions at 1.8 Gy per fraction.  Left breast was treated to 50.4 Gy in 28 fractions at 1.8  Gy per fraction.      06/22/2015 -  Anti-estrogen oral therapy    Anastrozole 1 mg daily       CHIEF COMPLIANT: Follow-up on anastrozole  INTERVAL HISTORY: Brianna Jones is a 66 year old with above-mentioned history of left breast cancer treated with bilateral lumpectomy followed by adjuvant radiation and is currently on anastrozole. She denies any lumps or nodules in the breasts.  REVIEW OF SYSTEMS:   Constitutional: Denies fevers, chills or abnormal weight loss Eyes: Denies blurriness of vision Ears, nose, mouth, throat, and face: Denies mucositis or sore throat Respiratory: Denies cough, dyspnea or wheezes Cardiovascular: Denies palpitation, chest discomfort Gastrointestinal:  Denies nausea, heartburn or change in bowel habits Skin: Denies abnormal skin rashes Lymphatics: Denies new lymphadenopathy or easy bruising Neurological:Denies numbness, tingling or new weaknesses Behavioral/Psych: Mood is stable, no new changes  Extremities: No lower extremity edema Breast:  denies any pain or lumps or nodules in either breasts All other systems were reviewed with the patient and are negative.  I have reviewed the past medical history, past surgical history, social history and family history with the patient and they are unchanged from previous note.  ALLERGIES:  has No Known Allergies.  MEDICATIONS:  Current Outpatient Prescriptions  Medication Sig Dispense Refill  . amLODipine (NORVASC) 10 MG tablet Take 10 mg by mouth daily.    Marland Kitchen anastrozole (ARIMIDEX) 1 MG tablet Take 1 tablet (1 mg total)  by mouth daily. 90 tablet 3  . Cholecalciferol (VITAMIN D3) 5000 UNITS CAPS Take 1 capsule by mouth daily. Reported on 03/15/2015    . gabapentin (NEURONTIN) 300 MG capsule Take 300-600 mg by mouth 2 (two) times daily. 1 capsule in the morning, 2 capsules at bedtime    . meloxicam (MOBIC) 15 MG tablet Take 15 mg by mouth daily.    Marland Kitchen omega-3 acid ethyl esters (LOVAZA) 1 g capsule Take by  mouth 2 (two) times daily.    . traMADol (ULTRAM) 50 MG tablet Take 1 tablet (50 mg total) by mouth every 6 (six) hours as needed for moderate pain. (Patient not taking: Reported on 02/22/2016) 30 tablet 0  . valsartan-hydrochlorothiazide (DIOVAN-HCT) 160-25 MG tablet Take 1 tablet by mouth daily.     No current facility-administered medications for this visit.     PHYSICAL EXAMINATION: ECOG PERFORMANCE STATUS: 1 - Symptomatic but completely ambulatory  Vitals:   03/19/16 1513  BP: (!) 148/81  Pulse: 97  Resp: 18  Temp: 98.5 F (36.9 C)   Filed Weights   03/19/16 1513  Weight: 175 lb 8 oz (79.6 kg)    GENERAL:alert, no distress and comfortable SKIN: skin color, texture, turgor are normal, no rashes or significant lesions EYES: normal, Conjunctiva are pink and non-injected, sclera clear OROPHARYNX:no exudate, no erythema and lips, buccal mucosa, and tongue normal  NECK: supple, thyroid normal size, non-tender, without nodularity LYMPH:  no palpable lymphadenopathy in the cervical, axillary or inguinal LUNGS: clear to auscultation and percussion with normal breathing effort HEART: regular rate & rhythm and no murmurs and no lower extremity edema ABDOMEN:abdomen soft, non-tender and normal bowel sounds MUSCULOSKELETAL:no cyanosis of digits and no clubbing  NEURO: alert & oriented x 3 with fluent speech, no focal motor/sensory deficits EXTREMITIES: No lower extremity edema BREAST: No palpable masses or nodules in either right or left breasts. No palpable axillary supraclavicular or infraclavicular adenopathy no breast tenderness or nipple discharge. (exam performed in the presence of a chaperone)  LABORATORY DATA:  I have reviewed the data as listed   Chemistry      Component Value Date/Time   NA 140 02/15/2015 1433   NA 140 01/18/2015 0827   K 3.9 02/15/2015 1433   K 3.6 01/18/2015 0827   CL 102 02/15/2015 1433   CO2 29 02/15/2015 1433   CO2 28 01/18/2015 0827   BUN 15  02/15/2015 1433   BUN 11.8 01/18/2015 0827   CREATININE 0.84 02/15/2015 1433   CREATININE 0.9 01/18/2015 0827      Component Value Date/Time   CALCIUM 10.3 02/15/2015 1433   CALCIUM 10.0 01/18/2015 0827   ALKPHOS 63 02/15/2015 1433   ALKPHOS 75 01/18/2015 0827   AST 25 02/15/2015 1433   AST 25 01/18/2015 0827   ALT 27 02/15/2015 1433   ALT 22 01/18/2015 0827   BILITOT 0.5 02/15/2015 1433   BILITOT 0.35 01/18/2015 0827       Lab Results  Component Value Date   WBC 7.7 02/15/2015   HGB 13.2 02/15/2015   HCT 40.0 02/15/2015   MCV 88.7 02/15/2015   PLT 185 02/15/2015   NEUTROABS 5.1 02/15/2015    ASSESSMENT & PLAN:  Breast cancer of upper-inner quadrant of right female breast ([place]) RIGHT breast biopsy: 12:30: Invasive lobular carcinoma grade 1, ER 95%, PR 0% insufficient for her to our Ki-67; LCIS with necrosis and calcifications; LEFT breast biopsy upper outer quadrant: DCIS with necrosis and calcifications, ER 100%, PR  80% Right lumpectomy 02/23/2015: ALH, LCIS, 0/1 lymph node Pathological staging: T1a N0 stage IA Adjuvant radiation therapy completed 05/25/2015  Current treatment: Adjuvant anastrozole 1 mg daily 5 years started 06/24/2015 Anastrozole toxicities: 1. Intermittent hot flashes 2. musculoskeletal stiffness The side effects are tolerable.  Return to clinic in 6 months for follow up   I spent 25 minutes talking to the patient of which more than half was spent in counseling and coordination of care.  No orders of the defined types were placed in this encounter.  The patient has a good understanding of the overall plan. she agrees with it. she will call with any problems that may develop before the next visit here.   Rulon Eisenmenger, MD 03/19/16

## 2016-03-25 DIAGNOSIS — H35033 Hypertensive retinopathy, bilateral: Secondary | ICD-10-CM | POA: Diagnosis not present

## 2016-03-25 DIAGNOSIS — H25813 Combined forms of age-related cataract, bilateral: Secondary | ICD-10-CM | POA: Diagnosis not present

## 2016-03-25 DIAGNOSIS — H11001 Unspecified pterygium of right eye: Secondary | ICD-10-CM | POA: Diagnosis not present

## 2016-04-19 DIAGNOSIS — D0592 Unspecified type of carcinoma in situ of left breast: Secondary | ICD-10-CM | POA: Diagnosis not present

## 2016-04-19 DIAGNOSIS — C50911 Malignant neoplasm of unspecified site of right female breast: Secondary | ICD-10-CM | POA: Diagnosis not present

## 2016-04-29 DIAGNOSIS — C50911 Malignant neoplasm of unspecified site of right female breast: Secondary | ICD-10-CM | POA: Diagnosis not present

## 2016-05-09 DIAGNOSIS — J320 Chronic maxillary sinusitis: Secondary | ICD-10-CM | POA: Diagnosis not present

## 2016-05-09 DIAGNOSIS — I10 Essential (primary) hypertension: Secondary | ICD-10-CM | POA: Diagnosis not present

## 2016-05-14 DIAGNOSIS — M48062 Spinal stenosis, lumbar region with neurogenic claudication: Secondary | ICD-10-CM | POA: Diagnosis not present

## 2016-06-03 DIAGNOSIS — M48062 Spinal stenosis, lumbar region with neurogenic claudication: Secondary | ICD-10-CM | POA: Diagnosis not present

## 2016-06-23 ENCOUNTER — Other Ambulatory Visit: Payer: Self-pay | Admitting: Hematology and Oncology

## 2016-10-09 DIAGNOSIS — C50911 Malignant neoplasm of unspecified site of right female breast: Secondary | ICD-10-CM | POA: Diagnosis not present

## 2016-10-09 DIAGNOSIS — D0592 Unspecified type of carcinoma in situ of left breast: Secondary | ICD-10-CM | POA: Diagnosis not present

## 2016-10-15 DIAGNOSIS — E559 Vitamin D deficiency, unspecified: Secondary | ICD-10-CM | POA: Diagnosis not present

## 2016-10-15 DIAGNOSIS — Z23 Encounter for immunization: Secondary | ICD-10-CM | POA: Diagnosis not present

## 2016-10-15 DIAGNOSIS — I10 Essential (primary) hypertension: Secondary | ICD-10-CM | POA: Diagnosis not present

## 2016-10-15 DIAGNOSIS — R7309 Other abnormal glucose: Secondary | ICD-10-CM | POA: Diagnosis not present

## 2016-10-15 DIAGNOSIS — Z0000 Encounter for general adult medical examination without abnormal findings: Secondary | ICD-10-CM | POA: Diagnosis not present

## 2016-10-15 DIAGNOSIS — Z1389 Encounter for screening for other disorder: Secondary | ICD-10-CM | POA: Diagnosis not present

## 2016-11-15 ENCOUNTER — Other Ambulatory Visit: Payer: Self-pay | Admitting: Surgery

## 2016-11-15 DIAGNOSIS — Z853 Personal history of malignant neoplasm of breast: Secondary | ICD-10-CM

## 2016-12-26 ENCOUNTER — Ambulatory Visit
Admission: RE | Admit: 2016-12-26 | Discharge: 2016-12-26 | Disposition: A | Discharge status: Home / Self Care | Payer: Medicare Other | Source: Ambulatory Visit | Attending: Surgery | Admitting: Surgery

## 2016-12-26 DIAGNOSIS — R928 Other abnormal and inconclusive findings on diagnostic imaging of breast: Secondary | ICD-10-CM | POA: Diagnosis not present

## 2016-12-26 DIAGNOSIS — Z853 Personal history of malignant neoplasm of breast: Secondary | ICD-10-CM

## 2016-12-26 HISTORY — DX: Personal history of irradiation: Z92.3

## 2017-03-20 ENCOUNTER — Telehealth: Payer: Self-pay | Admitting: Hematology and Oncology

## 2017-03-20 ENCOUNTER — Inpatient Hospital Stay: Payer: Medicare Other | Attending: Hematology and Oncology | Admitting: Hematology and Oncology

## 2017-03-20 DIAGNOSIS — Z923 Personal history of irradiation: Secondary | ICD-10-CM | POA: Insufficient documentation

## 2017-03-20 DIAGNOSIS — C50211 Malignant neoplasm of upper-inner quadrant of right female breast: Secondary | ICD-10-CM | POA: Insufficient documentation

## 2017-03-20 DIAGNOSIS — Z79899 Other long term (current) drug therapy: Secondary | ICD-10-CM | POA: Diagnosis not present

## 2017-03-20 DIAGNOSIS — R232 Flushing: Secondary | ICD-10-CM | POA: Insufficient documentation

## 2017-03-20 DIAGNOSIS — Z79811 Long term (current) use of aromatase inhibitors: Secondary | ICD-10-CM | POA: Diagnosis not present

## 2017-03-20 DIAGNOSIS — H43391 Other vitreous opacities, right eye: Secondary | ICD-10-CM | POA: Diagnosis not present

## 2017-03-20 DIAGNOSIS — Z170 Estrogen receptor positive status [ER+]: Secondary | ICD-10-CM | POA: Insufficient documentation

## 2017-03-20 MED ORDER — ANASTROZOLE 1 MG PO TABS: 1.0000 mg | ORAL_TABLET | Freq: Every day | ORAL | 3 refills | Status: DC

## 2017-03-20 NOTE — Assessment & Plan Note (Signed)
RIGHT breast biopsy: 12:30: Invasive lobular carcinoma grade 1, ER 95%, PR 0% insufficient for her to our Ki-67; LCIS with necrosis and calcifications; LEFT breast biopsy upper outer quadrant: DCIS with necrosis and calcifications, ER 100%, PR 80% Right lumpectomy 02/23/2015: ALH, LCIS, 0/1 lymph node Pathological staging: T1a N0 stage IA Adjuvant radiation therapy completed 05/25/2015  Current treatment: Adjuvant anastrozole 1 mg daily 5 years started 06/24/2015  Anastrozole toxicities: 1. Intermittent hot flashes 2. musculoskeletal stiffness The side effects are tolerable.  Surveillance: 1. Breast exam 03/20/2017: Benign1.  2. mammogram 12/26/2016: Interval development of 3 mm group of dystrophic calcifications in the left breast, six-month follow-up mammogram recommended, breast density category B  Return to clinic in 1 year for follow up

## 2017-03-20 NOTE — Progress Notes (Signed)
Patient Care Team: Glendale Chard, MD as PCP - General (Internal Medicine) Alphonsa Overall, MD as Consulting Physician (General Surgery) Nicholas Lose, MD as Consulting Physician (Hematology and Oncology) Gery Pray, MD as Consulting Physician (Radiation Oncology) Sylvan Cheese, NP as Nurse Practitioner (Hematology and Oncology) Irene Limbo, MD as Consulting Physician (Plastic Surgery)  DIAGNOSIS:  Encounter Diagnosis  Name Primary?  . Malignant neoplasm of upper-inner quadrant of right breast in female, estrogen receptor positive ([name])     SUMMARY OF ONCOLOGIC HISTORY:   Breast cancer of upper-inner quadrant of right female breast ([name])   12/30/2014 Mammogram    Right breast mass at 12:30: 1 x 0.6 x 1.1 cm; left breast calcifications spanning 4 cm with a titer group measuring 5 mm, no pathologic lymph nodes      01/06/2015 Initial Diagnosis    RIGHT breast biopsy: 12:30: Invasive lobular carcinoma grade 1, ER 95%, PR 0%, insufficient for HER2 or Ki-67; LCIS with necrosis and calcifications; LEFT breast biopsy upper outer quadrant: DCIS with necrosis and calcifications, ER 100%, PR 80%      02/23/2015 Surgery    Right lumpectomy: ALH, LCIS, 0/1 lymph node      04/17/2015 - 05/25/2015 Radiation Therapy    Adjuvant radiation therapy ([facility]): Right breast was treated to 50.4 Gy in 28 fractions at 1.8 Gy per fraction.  Left breast was treated to 50.4 Gy in 28 fractions at 1.8 Gy per fraction.      06/22/2015 -  Anti-estrogen oral therapy    Anastrozole 1 mg daily       Breast cancer of upper-outer quadrant of left female breast ([name])   02/23/2015 Surgery    Left lumpectomy [name]): DCIS with calcifications, 4 mm margin, ER 95%, PR 80%, Tis N0 stage 0      04/17/2015 - 05/25/2015 Radiation Therapy    Adjuvant radiation therapy ([facility]): Right breast was treated to 50.4 Gy in 28 fractions at 1.8 Gy per fraction.  Left breast was treated to 50.4 Gy in 28 fractions  at 1.8 Gy per fraction.      06/22/2015 -  Anti-estrogen oral therapy    Anastrozole 1 mg daily       CHIEF COMPLIANT: Follow-up on anastrozole therapy  INTERVAL HISTORY: Brianna Jones is a 67 year old with above-mentioned history of bilateral breast cancers.  The right side she had invasive lobular in the left side she had DCIS.  She underwent bilateral lumpectomies and radiation's and is currently on anastrozole therapy and appears to be tolerating it very well.  She has some muscle aches and stiffness especially in the morning which gets better through the day.  Denies any significant hot flashes.  She denies any pain discomfort in the breasts on a day-to-day basis however occasionally she feels a twinge of discomfort in the breast.  REVIEW OF SYSTEMS:   Constitutional: Denies fevers, chills or abnormal weight loss Eyes: Denies blurriness of vision Ears, nose, mouth, throat, and face: Denies mucositis or sore throat Respiratory: Denies cough, dyspnea or wheezes Cardiovascular: Denies palpitation, chest discomfort Gastrointestinal:  Denies nausea, heartburn or change in bowel habits Skin: Denies abnormal skin rashes Lymphatics: Denies new lymphadenopathy or easy bruising Neurological:Denies numbness, tingling or new weaknesses Behavioral/Psych: Mood is stable, no new changes  Extremities: No lower extremity edema Breast:  denies any pain or lumps or nodules in either breasts All other systems were reviewed with the patient and are negative.  I have reviewed the past medical history, past  surgical history, social history and family history with the patient and they are unchanged from previous note.  ALLERGIES:  has No Known Allergies.  MEDICATIONS:  Current Outpatient Medications  Medication Sig Dispense Refill  . amLODipine (NORVASC) 10 MG tablet Take 10 mg by mouth daily.    Marland Kitchen anastrozole (ARIMIDEX) 1 MG tablet Take 1 tablet (1 mg total) by mouth daily. 90 tablet 3  .  Cholecalciferol (VITAMIN D3) 5000 UNITS CAPS Take 1 capsule by mouth daily. Reported on 03/15/2015    . gabapentin (NEURONTIN) 300 MG capsule Take 300-600 mg by mouth 2 (two) times daily. 1 capsule in the morning, 2 capsules at bedtime    . meloxicam (MOBIC) 15 MG tablet Take 15 mg by mouth daily.    Marland Kitchen omega-3 acid ethyl esters (LOVAZA) 1 g capsule Take by mouth 2 (two) times daily.    . traMADol (ULTRAM) 50 MG tablet Take 1 tablet (50 mg total) by mouth every 6 (six) hours as needed for moderate pain. (Patient not taking: Reported on 02/22/2016) 30 tablet 0  . valsartan-hydrochlorothiazide (DIOVAN-HCT) 160-25 MG tablet Take 1 tablet by mouth daily.     No current facility-administered medications for this visit.     PHYSICAL EXAMINATION: ECOG PERFORMANCE STATUS: 1 - Symptomatic but completely ambulatory  Vitals:   03/20/17 1021  BP: 124/71  Pulse: 77  Resp: 18  Temp: 98.5 F (36.9 C)  SpO2: 100%   Filed Weights   03/20/17 1021  Weight: 166 lb 11.2 oz (75.6 kg)    GENERAL:alert, no distress and comfortable SKIN: skin color, texture, turgor are normal, no rashes or significant lesions EYES: normal, Conjunctiva are pink and non-injected, sclera clear OROPHARYNX:no exudate, no erythema and lips, buccal mucosa, and tongue normal  NECK: supple, thyroid normal size, non-tender, without nodularity LYMPH:  no palpable lymphadenopathy in the cervical, axillary or inguinal LUNGS: clear to auscultation and percussion with normal breathing effort HEART: regular rate & rhythm and no murmurs and no lower extremity edema ABDOMEN:abdomen soft, non-tender and normal bowel sounds MUSCULOSKELETAL:no cyanosis of digits and no clubbing  NEURO: alert & oriented x 3 with fluent speech, no focal motor/sensory deficits EXTREMITIES: No lower extremity edema BREAST: No palpable masses or nodules in either right or left breasts. No palpable axillary supraclavicular or infraclavicular adenopathy no breast  tenderness or nipple discharge. (exam performed in the presence of a chaperone)  LABORATORY DATA:  I have reviewed the data as listed CMP Latest Ref Rng & Units 02/15/2015 01/18/2015 11/25/2008  Glucose 65 - 99 mg/dL 94 95 129(H)  BUN 6 - 20 mg/dL 15 11.8 11  Creatinine 0.44 - 1.00 mg/dL 0.84 0.9 0.77  Sodium 135 - 145 mmol/L 140 140 138  Potassium 3.5 - 5.1 mmol/L 3.9 3.6 3.4(L)  Chloride 101 - 111 mmol/L 102 - 104  CO2 22 - 32 mmol/L '29 28 26  ' Calcium 8.9 - 10.3 mg/dL 10.3 10.0 9.8  Total Protein 6.5 - 8.1 g/dL 6.9 7.8 -  Total Bilirubin 0.3 - 1.2 mg/dL 0.5 0.35 -  Alkaline Phos 38 - 126 U/L 63 75 -  AST 15 - 41 U/L 25 25 -  ALT 14 - 54 U/L 27 22 -    Lab Results  Component Value Date   WBC 7.7 02/15/2015   HGB 13.2 02/15/2015   HCT 40.0 02/15/2015   MCV 88.7 02/15/2015   PLT 185 02/15/2015   NEUTROABS 5.1 02/15/2015    ASSESSMENT & PLAN:  Breast cancer  of upper-inner quadrant of right female breast [facility]) RIGHT breast biopsy: 12:30: Invasive lobular carcinoma grade 1, ER 95%, PR 0% insufficient for her to our Ki-67; LCIS with necrosis and calcifications; LEFT breast biopsy upper outer quadrant: DCIS with necrosis and calcifications, ER 100%, PR 80% Right lumpectomy 02/23/2015: ALH, LCIS, 0/1 lymph node Left lumpectomy: DCIS ER PR positive Pathological staging: T1a N0 stage IA Adjuvant radiation therapy completed 05/25/2015  Current treatment: Adjuvant anastrozole 1 mg daily 5 years started 06/24/2015  Anastrozole toxicities: 1. Intermittent hot flashes 2. musculoskeletal stiffness The side effects are tolerable.  Surveillance: 1. Breast exam 03/20/2017: Benign 2. 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 She tries to get some time for  exercise. Return to clinic in 1 year for follow up   I spent 25 minutes talking to the patient of which more than half was spent in counseling and coordination of care.  No orders of the defined types were placed in this encounter.  The patient has a good understanding of the overall plan. she agrees with it. she will call with any problems that may develop before the next visit here.   Harriette Ohara, MD 03/20/17

## 2017-03-20 NOTE — Telephone Encounter (Signed)
Gave avs and calendar for February 2020 °

## 2017-03-22 IMAGING — US US  BREAST BX W/ LOC DEV 1ST LESION IMG BX SPEC US GUIDE*R*
1 series · 13 of 13 positions shown · non-contrast
Comparison: Previous exam(s).

ADDENDUM:
Pathology reveals Grade I INVASIVE LOBULAR CARCINOMA, LOBULAR
CARCINOMA IN SITU WITH NECROSIS AND CALCIFICATIONS of the Right
breast at the 12:30 o'clock location. Intermediate to High grade
DUCTAL CARCINOMA IN SITU WITH NECROSIS AND CALCIFICATIONS of the
upper outer quadrant of the Left breast. This was to be concordant
by Dr. Tsabit Gufron. Pathology results were discussed with the patient
via telephone. She reported tenderness at the biopsy sites and is
doing well otherwise. Post biopsy care and instructions were
reviewed and questions were answered. She was encouraged to contact
The [REDACTED] with any additional
questions and or concerns. She was referred to [REDACTED] [REDACTED] at [REDACTED] [REDACTED] January 18, 2015.

Pathology results reported by Tripti Tiger RN on January 10, 2015.
CLINICAL DATA: Patient presents for ultrasound-guided core biopsy
of right breast mass.
EXAM:
ULTRASOUND GUIDED RIGHT BREAST CORE NEEDLE BIOPSY

[Series 2: advbreast · 13 of 13 slices shown]
[im 1/13]
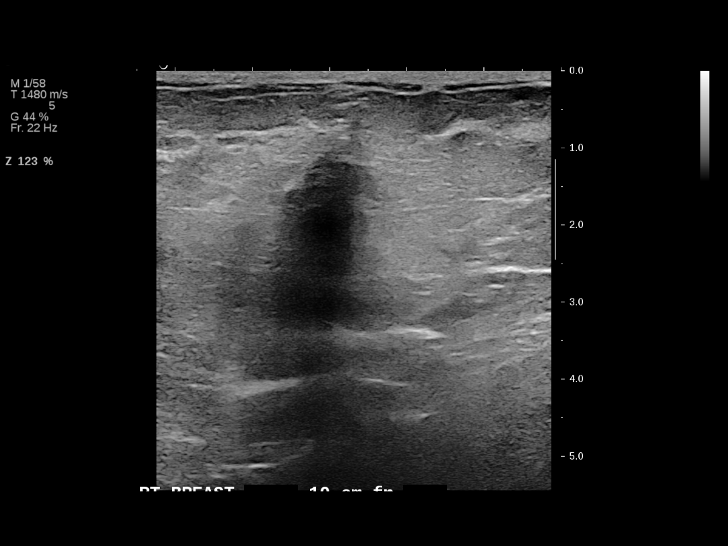
[im 2/13]
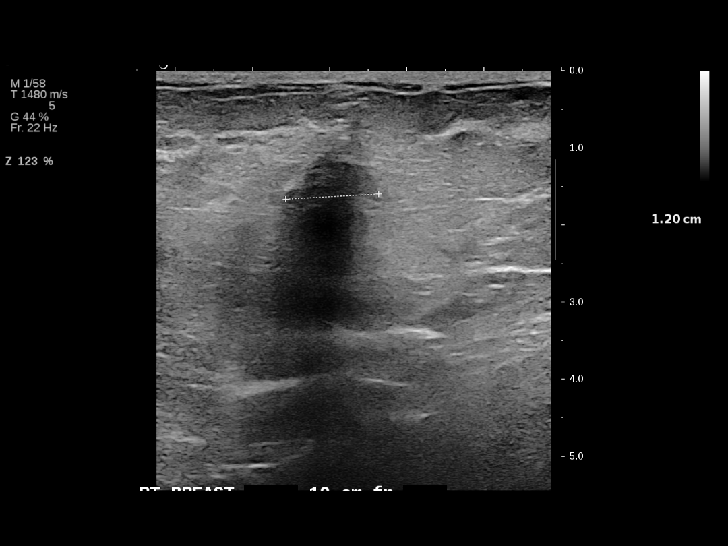
[im 3/13]
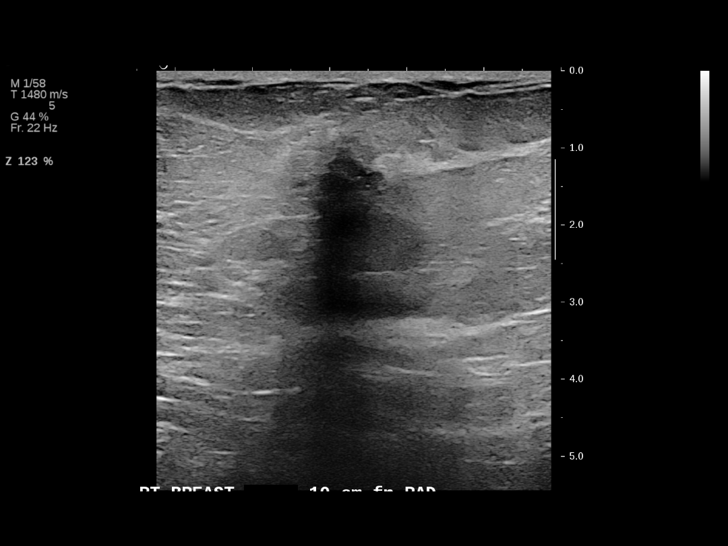
[im 4/13]
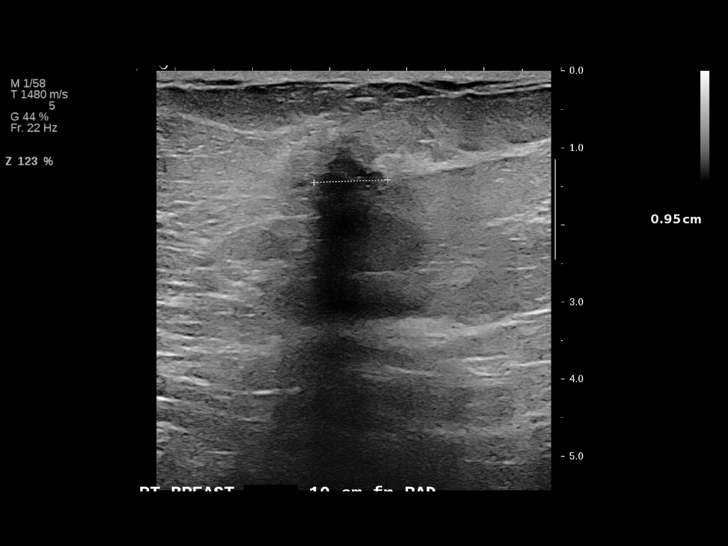
[im 5/13]
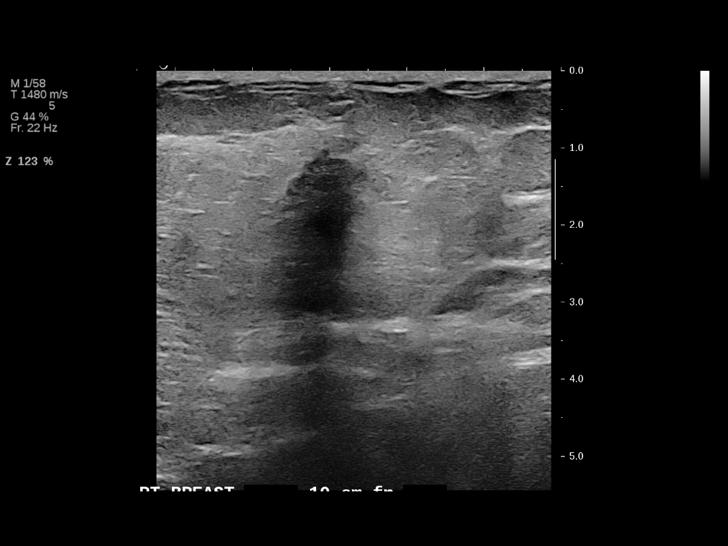
[im 6/13]
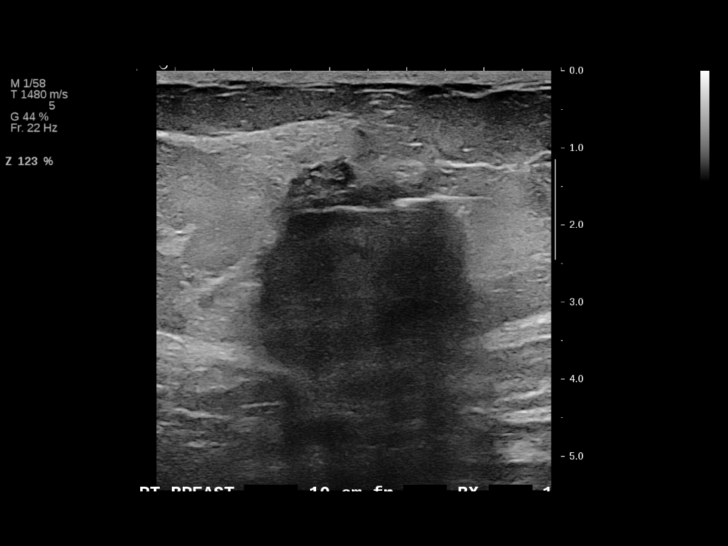
[im 7/13]
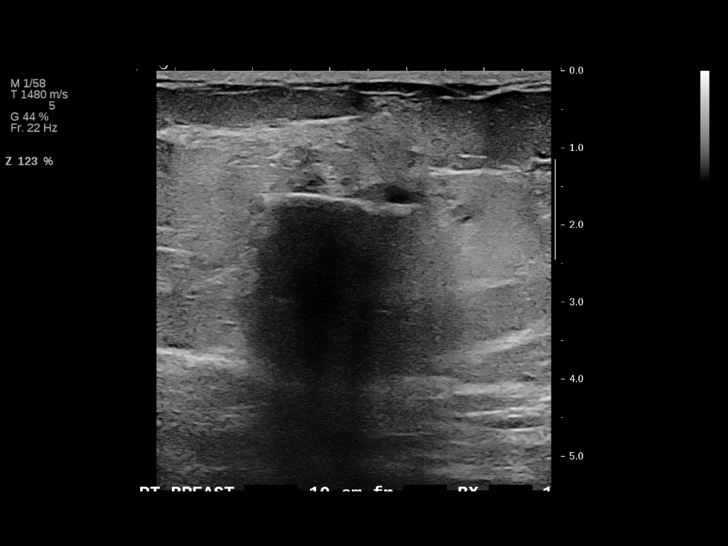
[im 8/13]
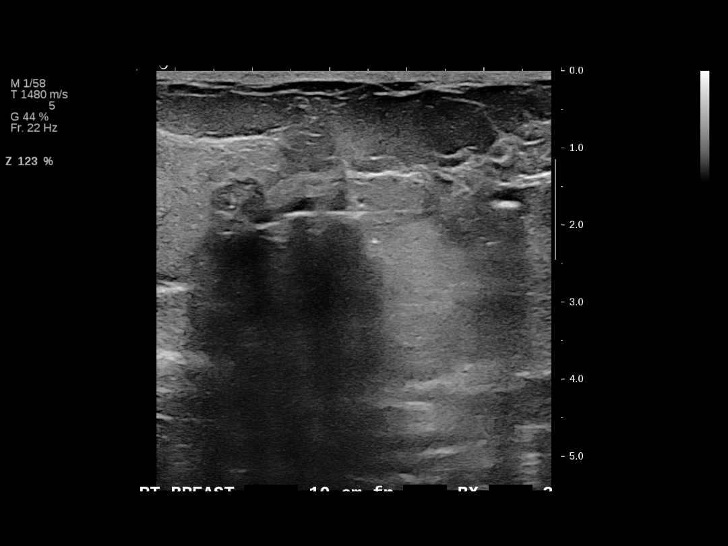
[im 9/13]
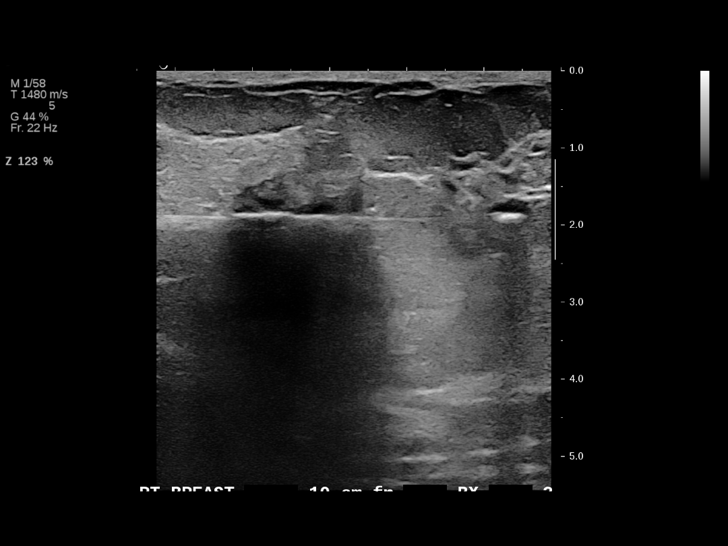
[im 10/13]
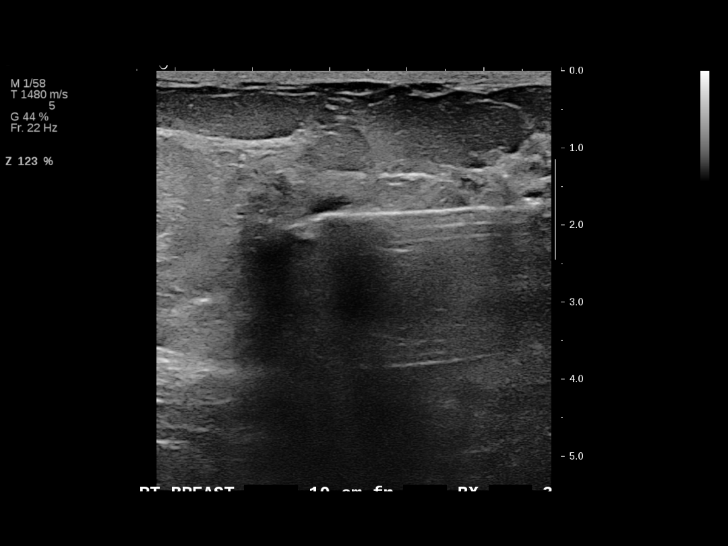
[im 11/13]
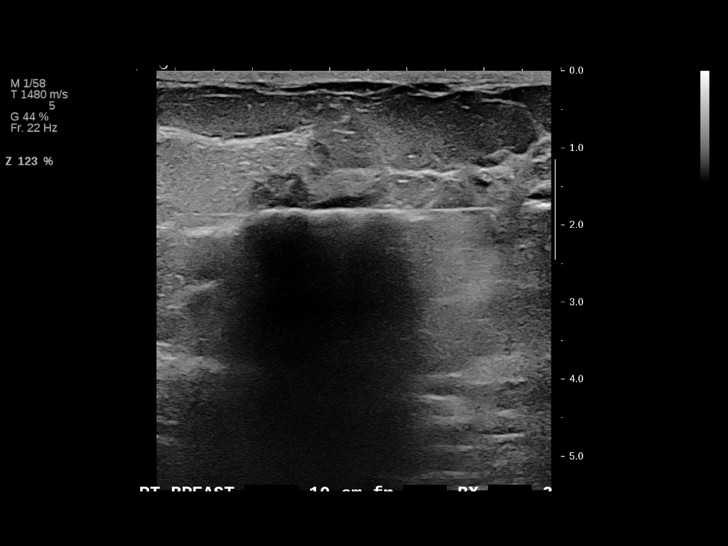
[im 12/13]
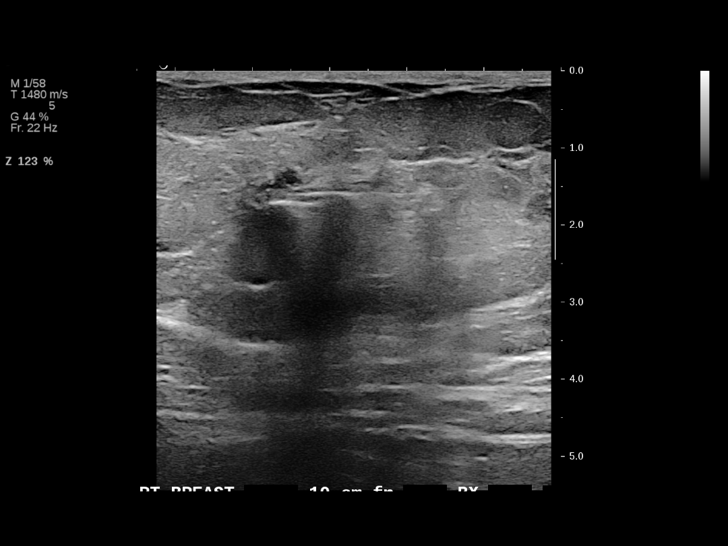
[im 13/13]
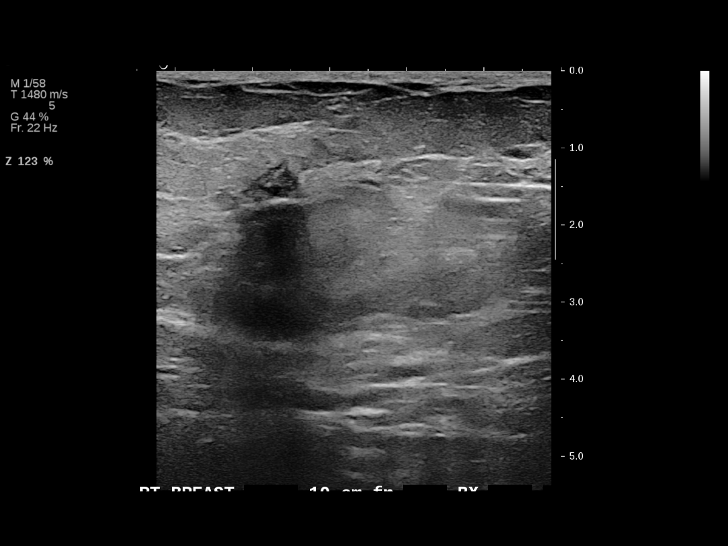

[13 of 13 positions shown; findings below may reference images not displayed]



Using sterile technique and 2% Lidocaine as local anesthetic, under
direct ultrasound visualization, a 14 gauge Tiger device was
used to perform biopsy of mass in the 12:30 o'clock location of the
right breast using a caudal approach. At the conclusion of the
procedure a ribbon shaped tissue marker clip was deployed into the
biopsy cavity. Follow up 2 view mammogram was performed and dictated
separately.
IMPRESSION: Ultrasound guided biopsy of right breast mass. No apparent
complications.

## 2017-03-22 IMAGING — MG MM BREAST BX W LOC DEV 1ST LESION IMAGE BX SPEC STEREO GUIDE*L*
5 series · 6 of 25 positions shown · non-contrast
Comparison: Previous exams.

ADDENDUM:
Pathology reveals Grade I INVASIVE LOBULAR CARCINOMA, LOBULAR
CARCINOMA IN SITU WITH NECROSIS AND CALCIFICATIONS of the Right
breast at the 12:30 o'clock location. Intermediate to High grade
DUCTAL CARCINOMA IN SITU WITH NECROSIS AND CALCIFICATIONS of the
upper outer quadrant of the Left breast. This was to be concordant
by Dr. Yuniel Mathai. Pathology results were discussed with the patient
via telephone. She reported tenderness at the biopsy sites and is
doing well otherwise. Post biopsy care and instructions were
reviewed and questions were answered. She was encouraged to contact
The [REDACTED] with any additional
questions and or concerns. She was referred to [REDACTED] [REDACTED] at [REDACTED] [REDACTED] January 18, 2015.

Pathology results reported by Gulzi Tanatarova RN on January 10, 2015.
CLINICAL DATA: Patient presents for stereotactic guided core biopsy
of left breast calcifications.
EXAM:
LEFT BREAST STEREOTACTIC CORE NEEDLE BIOPSY

[L CC tomo · 2 of 58 frames shown (1 of 5)]
[frame 19/58]
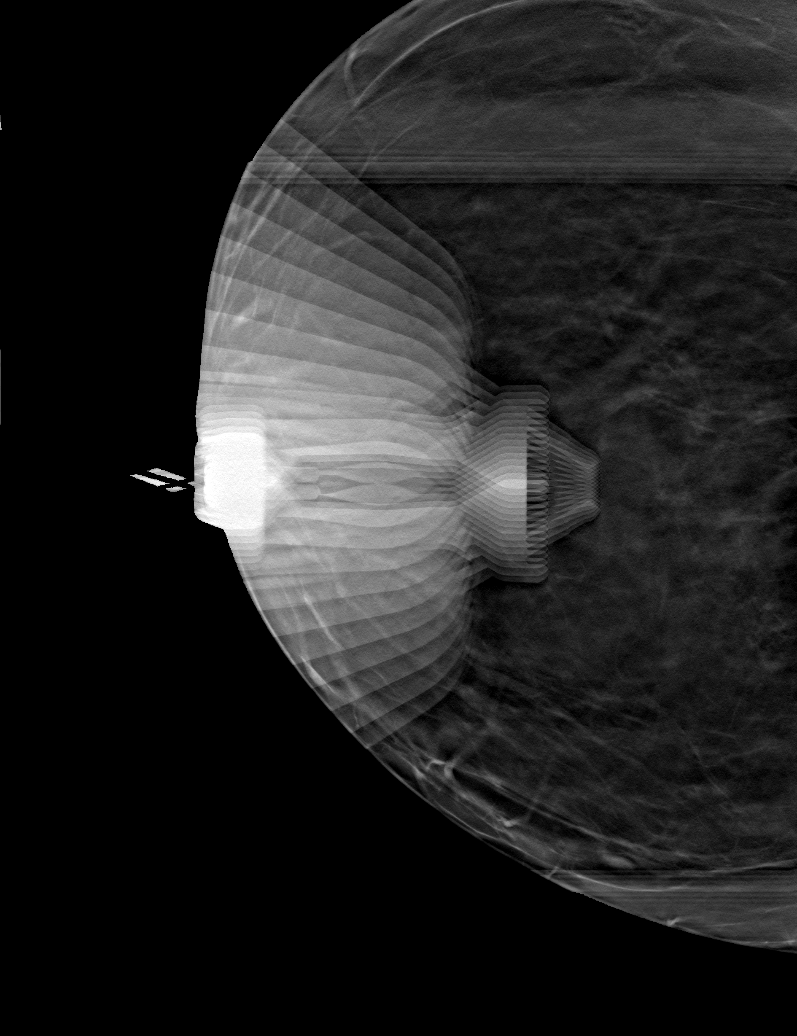
[frame 29/58]
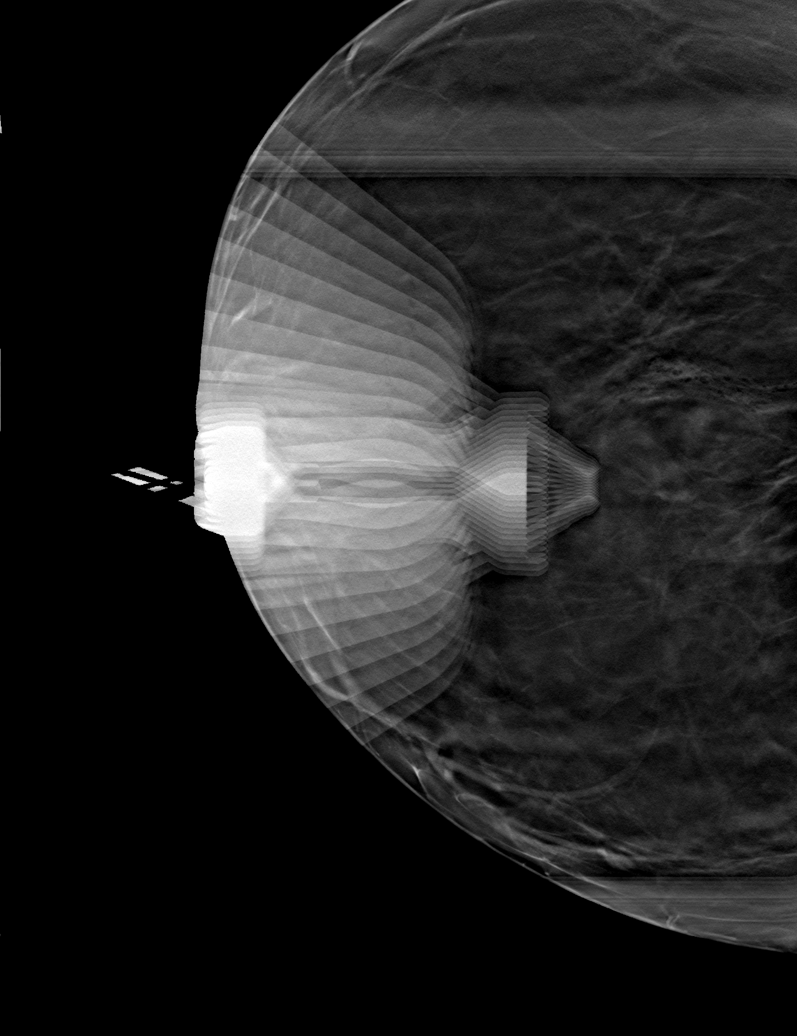

[L CC tomo (2 of 5) · tomo slice 29/58.0]
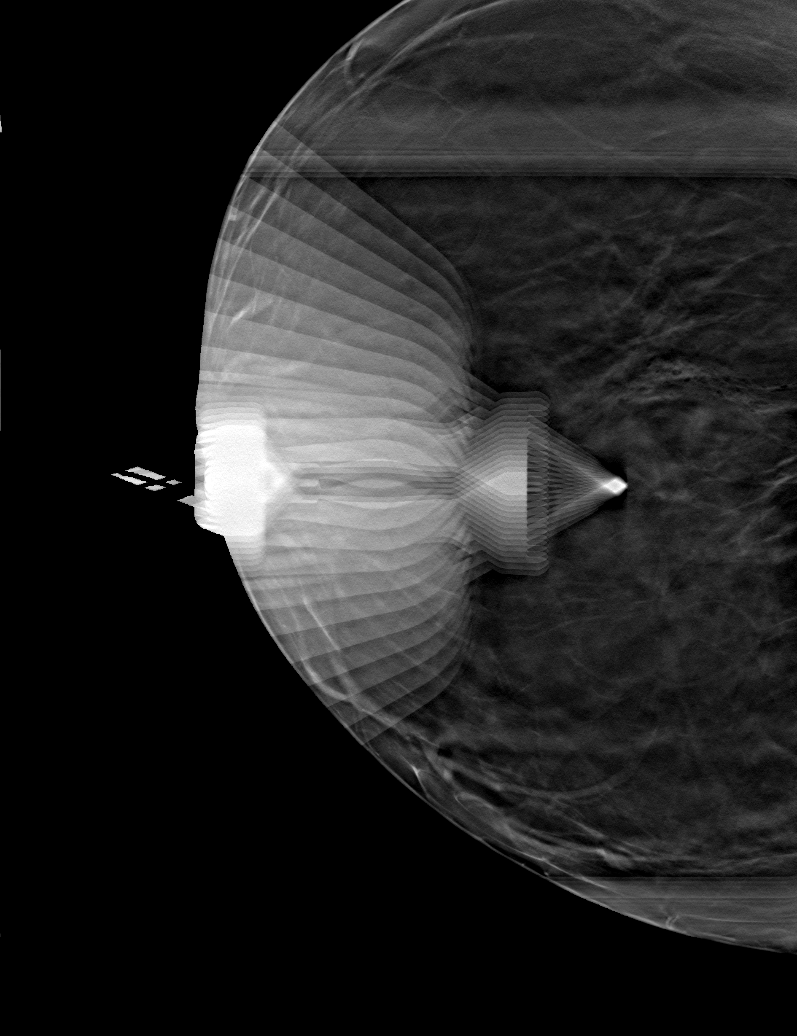

[L CC tomo (3 of 5) · tomo slice 29/58.0]
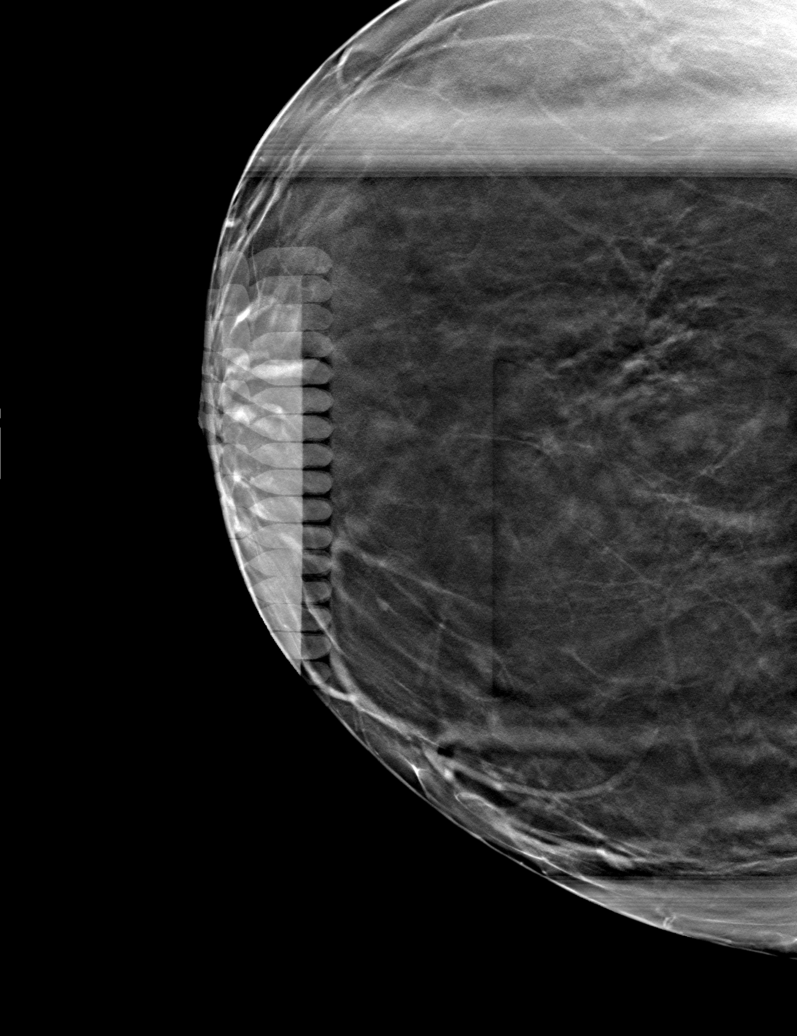

[L CC tomo (4 of 5) · tomo slice 29/58.0]
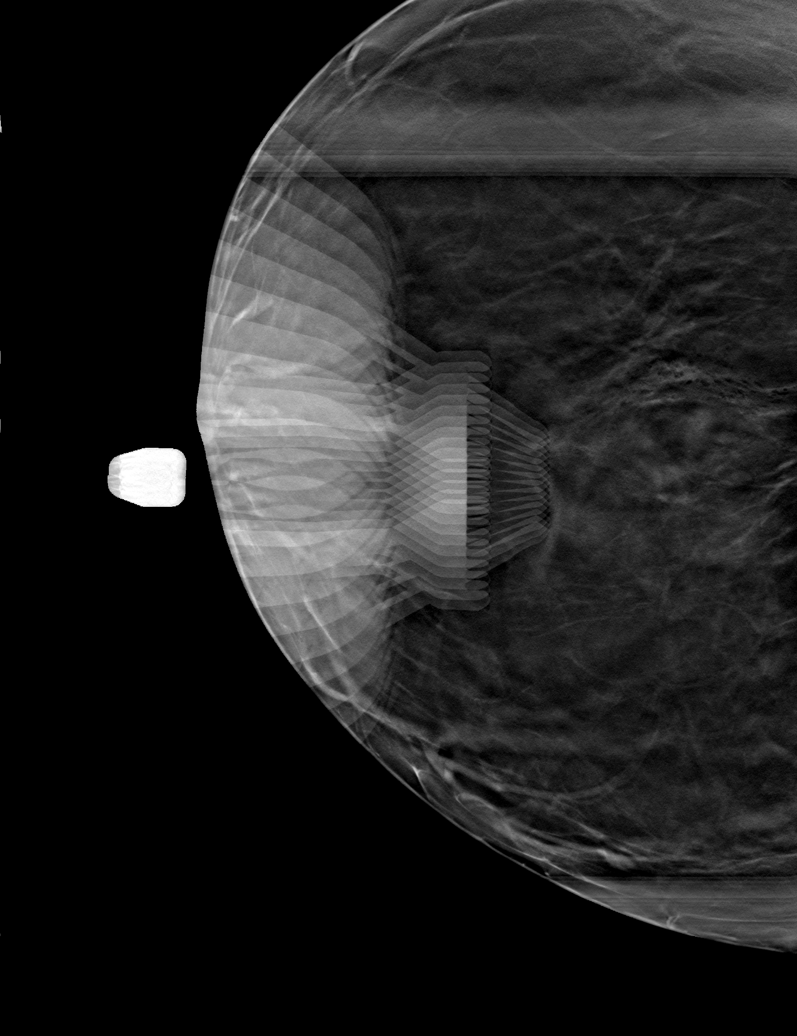

[L CC tomo (5 of 5) · tomo slice 29/58.0]
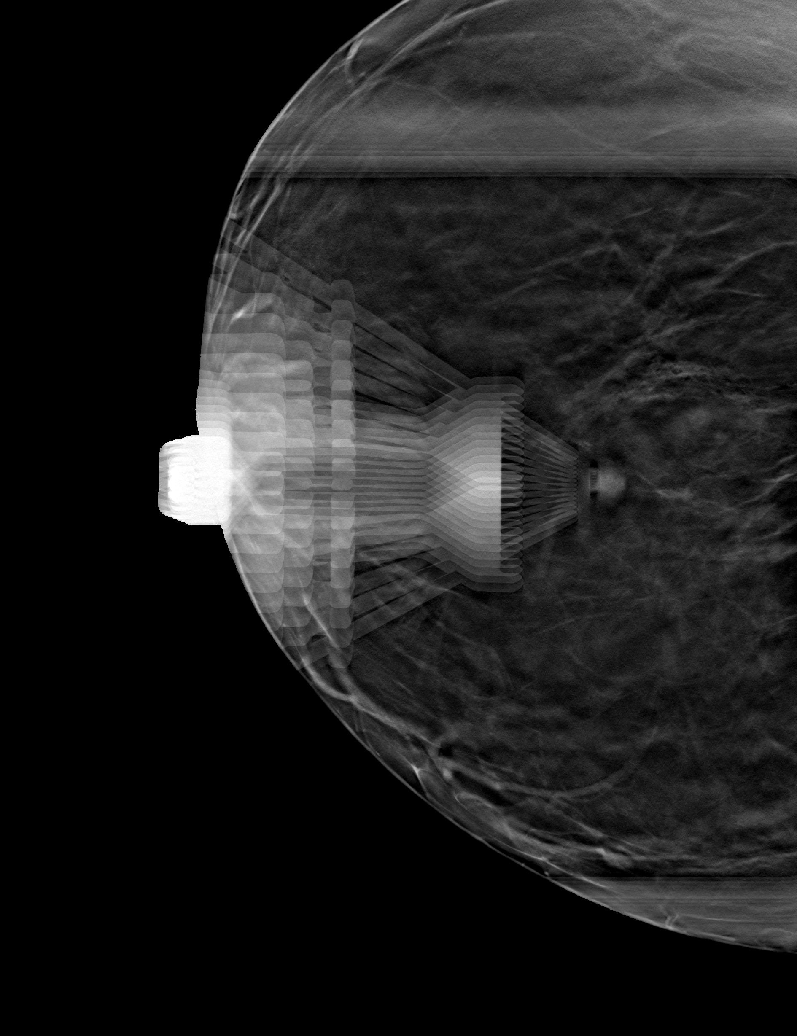

[6 of 25 positions shown; findings below may reference images not displayed]



Using sterile technique and 1% lidocaine as local anesthetic, under
stereotactic guidance, a 9 gauge vacuum assisted device was used to
perform core needle biopsy of calcifications in the upper-outer
quadrant of the left breast using a cephalad approach. Specimen
radiograph was performed showing calcifications to be present.
Specimens with calcifications are identified for pathology.

At the conclusion of the procedure, a coil show a tissue marker clip
was deployed into the biopsy cavity. Follow-up 2-view mammogram was
performed and dictated separately.
IMPRESSION: Stereotactic-guided biopsy of left breast calcifications. No
apparent complications.

## 2017-04-16 DIAGNOSIS — Z79899 Other long term (current) drug therapy: Secondary | ICD-10-CM | POA: Diagnosis not present

## 2017-04-16 DIAGNOSIS — R7309 Other abnormal glucose: Secondary | ICD-10-CM | POA: Diagnosis not present

## 2017-04-16 DIAGNOSIS — I10 Essential (primary) hypertension: Secondary | ICD-10-CM | POA: Diagnosis not present

## 2017-04-16 DIAGNOSIS — E785 Hyperlipidemia, unspecified: Secondary | ICD-10-CM | POA: Diagnosis not present

## 2017-05-06 DIAGNOSIS — J029 Acute pharyngitis, unspecified: Secondary | ICD-10-CM | POA: Diagnosis not present

## 2017-05-27 ENCOUNTER — Other Ambulatory Visit: Payer: Self-pay | Admitting: Surgery

## 2017-05-27 DIAGNOSIS — R921 Mammographic calcification found on diagnostic imaging of breast: Secondary | ICD-10-CM

## 2017-05-27 DIAGNOSIS — H9202 Otalgia, left ear: Secondary | ICD-10-CM | POA: Diagnosis not present

## 2017-05-27 DIAGNOSIS — R42 Dizziness and giddiness: Secondary | ICD-10-CM | POA: Diagnosis not present

## 2017-06-04 DIAGNOSIS — C50911 Malignant neoplasm of unspecified site of right female breast: Secondary | ICD-10-CM | POA: Diagnosis not present

## 2017-06-12 DIAGNOSIS — G5602 Carpal tunnel syndrome, left upper limb: Secondary | ICD-10-CM | POA: Diagnosis not present

## 2017-06-12 DIAGNOSIS — G5601 Carpal tunnel syndrome, right upper limb: Secondary | ICD-10-CM | POA: Diagnosis not present

## 2017-06-17 DIAGNOSIS — C50911 Malignant neoplasm of unspecified site of right female breast: Secondary | ICD-10-CM | POA: Diagnosis not present

## 2017-06-25 ENCOUNTER — Other Ambulatory Visit: Payer: Self-pay

## 2017-06-25 ENCOUNTER — Encounter (HOSPITAL_COMMUNITY): Payer: Self-pay

## 2017-06-25 ENCOUNTER — Emergency Department (HOSPITAL_COMMUNITY): Payer: Medicare Other

## 2017-06-25 ENCOUNTER — Emergency Department (HOSPITAL_COMMUNITY)
Admission: EM | Admit: 2017-06-25 | Discharge: 2017-06-25 | Disposition: A | Discharge status: Home / Self Care | Payer: Medicare Other | Attending: Emergency Medicine | Admitting: Emergency Medicine

## 2017-06-25 DIAGNOSIS — M79642 Pain in left hand: Secondary | ICD-10-CM | POA: Insufficient documentation

## 2017-06-25 DIAGNOSIS — M7989 Other specified soft tissue disorders: Secondary | ICD-10-CM | POA: Diagnosis not present

## 2017-06-25 DIAGNOSIS — M1812 Unilateral primary osteoarthritis of first carpometacarpal joint, left hand: Secondary | ICD-10-CM | POA: Diagnosis not present

## 2017-06-25 DIAGNOSIS — E790 Hyperuricemia without signs of inflammatory arthritis and tophaceous disease: Secondary | ICD-10-CM | POA: Diagnosis not present

## 2017-06-25 DIAGNOSIS — M79641 Pain in right hand: Secondary | ICD-10-CM | POA: Diagnosis not present

## 2017-06-25 LAB — CBC WITH DIFFERENTIAL/PLATELET
Basophils Absolute: 0 10*3/uL (ref 0.0–0.1)
Basophils Relative: 0 %
Eosinophils Absolute: 0.2 10*3/uL (ref 0.0–0.7)
Eosinophils Relative: 3 %
HCT: 43.6 % (ref 36.0–46.0)
Hemoglobin: 14.5 g/dL (ref 12.0–15.0)
Lymphocytes Relative: 21 %
Lymphs Abs: 1.2 10*3/uL (ref 0.7–4.0)
MCH: 28.8 pg (ref 26.0–34.0)
MCHC: 33.3 g/dL (ref 30.0–36.0)
MCV: 86.5 fL (ref 78.0–100.0)
Monocytes Absolute: 0.7 10*3/uL (ref 0.1–1.0)
Monocytes Relative: 12 %
Neutro Abs: 3.7 10*3/uL (ref 1.7–7.7)
Neutrophils Relative %: 64 %
Platelets: 226 10*3/uL (ref 150–400)
RBC: 5.04 MIL/uL (ref 3.87–5.11)
RDW: 15.8 % — ABNORMAL HIGH (ref 11.5–15.5)
WBC: 5.7 10*3/uL (ref 4.0–10.5)

## 2017-06-25 LAB — COMPREHENSIVE METABOLIC PANEL
ALT: 25 U/L (ref 14–54)
AST: 28 U/L (ref 15–41)
Albumin: 3.7 g/dL (ref 3.5–5.0)
Alkaline Phosphatase: 70 U/L (ref 38–126)
Anion gap: 8 (ref 5–15)
BUN: 13 mg/dL (ref 6–20)
CO2: 26 mmol/L (ref 22–32)
Calcium: 10 mg/dL (ref 8.9–10.3)
Chloride: 105 mmol/L (ref 101–111)
Creatinine, Ser: 0.76 mg/dL (ref 0.44–1.00)
GFR calc Af Amer: >60 mL/min (ref >60)
GFR calc non Af Amer: >60 mL/min (ref >60)
Glucose, Bld: 90 mg/dL (ref 65–99)
Potassium: 3.6 mmol/L (ref 3.5–5.1)
Sodium: 139 mmol/L (ref 135–145)
Total Bilirubin: 0.5 mg/dL (ref 0.3–1.2)
Total Protein: 7 g/dL (ref 6.5–8.1)

## 2017-06-25 LAB — URIC ACID: Uric Acid, Serum: 7.6 mg/dL — ABNORMAL HIGH (ref 2.3–6.6)

## 2017-06-25 MED ORDER — PREDNISONE 20 MG PO TABS
60.0000 mg | ORAL_TABLET | Freq: Once | ORAL | Status: AC
  Administered 2017-06-25: 60 mg via ORAL
  Filled 2017-06-25: qty 3

## 2017-06-25 MED ORDER — PREDNISONE 20 MG PO TABS: ORAL_TABLET | ORAL | 0 refills | Status: DC

## 2017-06-25 NOTE — ED Provider Notes (Signed)
[facility] DEPT Provider Note   CSN: [id number] Arrival date & time: 06/25/17  1503     History   Chief Complaint Chief Complaint  Patient presents with  . Hand Pain    HPI Brianna Jones is a 67 y.o. female hx of HTN, neuropathy, here presenting with bilateral hand pain and swelling.  Patient states that she has bilateral hand pain and swelling for the last 2 weeks.  She saw her orthopedic doctor, Dr. Grandville Silos, and was prescribed a course of prednisone.  She states that the pain still did not go away and she then saw him again and prescribe a course of Mobic.  Patient states that she has persistent pain and right ankle swelling.  Denies any leg pain or trouble breathing or abdominal pain or chest pain.  Patient has no known heart failure or renal failure or gout.   The history is provided by the patient.    Past Medical History:  Diagnosis Date  . Arthritis   . Breast cancer of upper-inner quadrant of right female breast ([place]) 01/11/2015  . Hypertension   . Neuropathy    rt foot  . Personal history of radiation therapy   . Radiation 04/17/15-05/25/15   right breast 50.4 Gy, left breast 50.4 Gy    Patient Active Problem List   Diagnosis Date Noted  . Bilateral breast cancer ([place]) 02/23/2015  . Breast cancer of upper-outer quadrant of left female breast ([place]) 01/18/2015  . Breast cancer of upper-inner quadrant of right female breast ([place]) 01/11/2015    Past Surgical History:  Procedure Laterality Date  . BREAST LUMPECTOMY WITH NEEDLE LOCALIZATION Left 02/23/2015   Procedure: LEFT BREAST LUMPECTOMY WITH TWO NEEDLE LOCALIZATION BRACKET;  Surgeon: Alphonsa Overall, MD;  Location: [facility];  Service: General;  Laterality: Left;  . BREAST LUMPECTOMY WITH RADIOACTIVE SEED AND SENTINEL LYMPH NODE BIOPSY Bilateral 02/23/2015  . BREAST LUMPECTOMY WITH RADIOACTIVE SEED AND SENTINEL LYMPH NODE BIOPSY Right 02/23/2015   Procedure: RIGHT BREAST LUMPECTOMY WITH  RADIOACTIVE SEED AND RIGHT AXILLARY SENTINEL LYMPH NODE BIOPSY;  Surgeon: Alphonsa Overall, MD;  Location: [facility];  Service: General;  Laterality: Right;  . BREAST REDUCTION SURGERY Bilateral 03/03/2015   Procedure: BILATERAL ONCOPLASTIC RECONSTRUCTION BY BREAST REDUCTION ;  Surgeon: Irene Limbo, MD;  Location: [facility];  Service: Plastics;  Laterality: Bilateral;  . FOOT SURGERY     RT FOOT BONE SPUR  . REDUCTION MAMMAPLASTY Bilateral 01/2015  . SHOULDER SURGERY Left      OB History   None      Home Medications    Prior to Admission medications   Medication Sig Start Date End Date Taking? Authorizing Provider  amLODipine (NORVASC) 10 MG tablet Take 10 mg by mouth daily.   Yes [provider]  anastrozole (ARIMIDEX) 1 MG tablet Take 1 tablet (1 mg total) by mouth daily. 03/20/17  Yes Nicholas Lose, MD  Cholecalciferol (VITAMIN D3) 5000 UNITS CAPS Take 1 capsule by mouth daily. Reported on 03/15/2015   Yes [provider]  Flaxseed, Linseed, (FLAX SEEDS PO) Take 1 tablet by mouth daily.   Yes [provider]  meloxicam (MOBIC) 15 MG tablet Take 15 mg by mouth daily.   Yes [provider]  naproxen sodium (ALEVE) 220 MG tablet Take 440 mg by mouth at bedtime as needed (pain).   Yes [provider]  omega-3 acid ethyl esters (LOVAZA) 1 g capsule Take by mouth 2 (two) times daily.  Yes [provider]  telmisartan-hydrochlorothiazide (MICARDIS HCT) 80-25 MG tablet Take 1 tablet by mouth daily. 03/26/17  Yes [provider]  traMADol (ULTRAM) 50 MG tablet Take 1 tablet (50 mg total) by mouth every 6 (six) hours as needed for moderate pain. 05/23/15  Yes Gery Pray, MD  NOREL AD 4-10-325 MG TABS Take 1 tablet by mouth 2 (two) times daily. 05/31/17   [provider]    Family History Family History  Problem Relation Age of Onset  . Diabetes Mother   . Heart disease Mother   . Hyperlipidemia Mother   .  Hypertension Mother   . Heart disease Father   . Hypertension Father   . Diabetes Brother   . Lung cancer Maternal Grandmother     Social History Social History   Tobacco Use  . Smoking status: Never Smoker  . Smokeless tobacco: Never Used  Substance Use Topics  . Alcohol use: Yes    Alcohol/week: 0.0 oz    Comment: 2-3  . Drug use: No     Allergies   Patient has no known allergies.   Review of Systems Review of Systems  Musculoskeletal:       Bilateral hand swelling and pain   All other systems reviewed and are negative.    Physical Exam Updated Vital Signs BP 119/79   Pulse 88   Temp 98.9 F (37.2 C) (Oral)   Resp 17   Ht 5\' 3"  (1.6 m)   Wt 70.3 kg (155 lb)   SpO2 100%   BMI 27.46 kg/m   Physical Exam  Constitutional: She is oriented to person, place, and time. She appears well-developed.  HENT:  Head: Normocephalic.  Mouth/Throat: Oropharynx is clear and moist.  Eyes: Pupils are equal, round, and reactive to light. Conjunctivae and EOM are normal.  Neck: Normal range of motion. Neck supple.  Cardiovascular: Normal rate, regular rhythm and normal heart sounds.  Pulmonary/Chest: Effort normal and breath sounds normal. No stridor. No respiratory distress. She has no wheezes.  Abdominal: Soft. Bowel sounds are normal. She exhibits no distension. There is no tenderness. There is no guarding.  Musculoskeletal: Normal range of motion.  Bilateral hand swelling. No obvious erythema. No obvious joint swelling. 1+ edema R ankle, no obvious calf tenderness   Neurological: She is oriented to person, place, and time.  Skin: Skin is warm.  Psychiatric: She has a normal mood and affect.  Nursing note and vitals reviewed.    ED Treatments / Results  Labs (all labs ordered are listed, but only abnormal results are displayed) Labs Reviewed  CBC WITH DIFFERENTIAL/PLATELET - Abnormal; Notable for the following components:      Result Value   RDW 15.8 (*)    All  other components within normal limits  URIC ACID - Abnormal; Notable for the following components:   Uric Acid, Serum 7.6 (*)    All other components within normal limits  COMPREHENSIVE METABOLIC PANEL    EKG None  Radiology Dg Hand Complete Left  Result Date: 06/25/2017 CLINICAL DATA:  Hand pain EXAM: LEFT HAND - COMPLETE 3+ VIEW COMPARISON:  None. FINDINGS: Generalized osteopenia. No fracture or dislocation. Mild osteoarthritis of the first [facility] joint, mild osteoarthritis of the first [place] joint and first MCP joint. Mild osteoarthritis of the scaphotrapeziotrapezoid joint. Mild osteoarthritis of second PIP joint. IMPRESSION: No acute osseous injury of the left hand. Electronically Signed   By: Kathreen Devoid   On: 06/25/2017 20:26  Dg Hand Complete Right  Result Date: 06/25/2017 CLINICAL DATA:  Bilateral hand swelling and itching palms. No trauma. EXAM: RIGHT HAND - COMPLETE 3+ VIEW COMPARISON:  None. FINDINGS: Scattered degenerative changes. No fractures or other bony abnormalities. IMPRESSION: Degenerative changes. Electronically Signed   By: Dorise Bullion III M.D   On: 06/25/2017 20:22    Procedures Procedures (including critical care time)  Medications Ordered in ED Medications  predniSONE (DELTASONE) tablet 60 mg (60 mg Oral Given 06/25/17 2124)     Initial Impression / Assessment and Plan / ED Course  I have reviewed the triage vital signs and the nursing notes.  Pertinent labs & imaging results that were available during my care of the patient were reviewed by me and considered in my medical decision making (see chart for details).     JANISE GORA is a 67 y.o. female here with bilateral hand swelling, R ankle swelling. Can be side effect of prednisone. Consider renal failure as well. No obvious joint swelling and no signs of septic joint. Will get labs, xrays.    9:48 PM Xrays unremarkable. Creatinine normal. Uric acid elevated at 7.6. I wonder if she has some  gout. Will have her continue mobic, add another course of prednisone. She has follow up with ortho.   Final Clinical Impressions(s) / ED Diagnoses   Final diagnoses:  None    ED Discharge Orders    None       Drenda Freeze, MD 06/25/17 2149

## 2017-06-25 NOTE — ED Triage Notes (Signed)
Pt reports that she has been having bilateral hand swelling, and pain 4/10 with itching palms,  that is worse on the rt hand. Pt reports that she has some ankle swelling as well. Pt reports that symptoms started 2 weeks ago. Pt states that she was placed on prednisone, Mobic for management by Orthro. Pt reports that pain has prevented her from sleeping.

## 2017-06-25 NOTE — Discharge Instructions (Signed)
Take prednisone as prescribed.   Continue mobic.   See your orthopedic doctor.   Your uric acid is elevated. You may have gout   Return to ER if you have worse pain or swelling, fever.

## 2017-06-25 NOTE — ED Notes (Signed)
Patient transported to X-ray 

## 2017-06-26 ENCOUNTER — Other Ambulatory Visit: Payer: Self-pay | Admitting: Surgery

## 2017-06-26 ENCOUNTER — Ambulatory Visit
Admission: RE | Admit: 2017-06-26 | Discharge: 2017-06-26 | Disposition: A | Discharge status: Home / Self Care | Payer: Medicare Other | Source: Ambulatory Visit | Attending: Surgery | Admitting: Surgery

## 2017-06-26 DIAGNOSIS — R921 Mammographic calcification found on diagnostic imaging of breast: Secondary | ICD-10-CM

## 2017-07-01 DIAGNOSIS — H9202 Otalgia, left ear: Secondary | ICD-10-CM | POA: Diagnosis not present

## 2017-07-01 DIAGNOSIS — H9209 Otalgia, unspecified ear: Secondary | ICD-10-CM | POA: Diagnosis not present

## 2017-07-09 DIAGNOSIS — G5601 Carpal tunnel syndrome, right upper limb: Secondary | ICD-10-CM | POA: Diagnosis not present

## 2017-07-09 DIAGNOSIS — G5602 Carpal tunnel syndrome, left upper limb: Secondary | ICD-10-CM | POA: Diagnosis not present

## 2017-07-16 DIAGNOSIS — M1812 Unilateral primary osteoarthritis of first carpometacarpal joint, left hand: Secondary | ICD-10-CM | POA: Diagnosis not present

## 2017-07-29 DIAGNOSIS — G5601 Carpal tunnel syndrome, right upper limb: Secondary | ICD-10-CM | POA: Diagnosis not present

## 2017-09-22 DIAGNOSIS — G5601 Carpal tunnel syndrome, right upper limb: Secondary | ICD-10-CM | POA: Diagnosis not present

## 2017-09-23 DIAGNOSIS — R770 Abnormality of albumin: Secondary | ICD-10-CM | POA: Diagnosis not present

## 2017-09-23 DIAGNOSIS — R234 Changes in skin texture: Secondary | ICD-10-CM | POA: Diagnosis not present

## 2017-09-23 DIAGNOSIS — R011 Cardiac murmur, unspecified: Secondary | ICD-10-CM | POA: Diagnosis not present

## 2017-09-23 DIAGNOSIS — R21 Rash and other nonspecific skin eruption: Secondary | ICD-10-CM | POA: Diagnosis not present

## 2017-09-23 DIAGNOSIS — M06211 Rheumatoid bursitis, right shoulder: Secondary | ICD-10-CM | POA: Diagnosis not present

## 2017-09-23 DIAGNOSIS — M2550 Pain in unspecified joint: Secondary | ICD-10-CM | POA: Diagnosis not present

## 2017-09-23 DIAGNOSIS — R609 Edema, unspecified: Secondary | ICD-10-CM | POA: Diagnosis not present

## 2017-10-01 ENCOUNTER — Telehealth: Payer: Self-pay | Admitting: *Deleted

## 2017-10-01 NOTE — Telephone Encounter (Signed)
REFERRAL SENT TO SCHEDULING, NOTES ON FILE FROM Audery Amel, PA

## 2017-10-02 ENCOUNTER — Telehealth: Payer: Self-pay

## 2017-10-02 NOTE — Telephone Encounter (Signed)
The internal medicine office asked if our office had an order for an echo. Spoke with [facility] and found that the order was printed and just needed to be entered. Called the internal medicine office to confirm the echo was placed.

## 2017-10-02 NOTE — Telephone Encounter (Signed)
Follow Up:     Twanda from [facility] Dr. Glendale Chard calling concerning some orders

## 2017-10-03 ENCOUNTER — Other Ambulatory Visit (HOSPITAL_COMMUNITY): Payer: Self-pay | Admitting: Internal Medicine

## 2017-10-03 ENCOUNTER — Ambulatory Visit (HOSPITAL_COMMUNITY): Payer: Medicare Other | Attending: Cardiovascular Disease

## 2017-10-03 ENCOUNTER — Other Ambulatory Visit: Payer: Self-pay

## 2017-10-03 DIAGNOSIS — R011 Cardiac murmur, unspecified: Secondary | ICD-10-CM

## 2017-10-03 DIAGNOSIS — I071 Rheumatic tricuspid insufficiency: Secondary | ICD-10-CM | POA: Insufficient documentation

## 2017-10-03 DIAGNOSIS — Z8249 Family history of ischemic heart disease and other diseases of the circulatory system: Secondary | ICD-10-CM | POA: Diagnosis not present

## 2017-10-03 DIAGNOSIS — Z923 Personal history of irradiation: Secondary | ICD-10-CM | POA: Insufficient documentation

## 2017-10-03 DIAGNOSIS — C50919 Malignant neoplasm of unspecified site of unspecified female breast: Secondary | ICD-10-CM | POA: Diagnosis not present

## 2017-10-03 DIAGNOSIS — I119 Hypertensive heart disease without heart failure: Secondary | ICD-10-CM | POA: Insufficient documentation

## 2017-10-14 DIAGNOSIS — M349 Systemic sclerosis, unspecified: Secondary | ICD-10-CM | POA: Insufficient documentation

## 2017-10-21 DIAGNOSIS — M2550 Pain in unspecified joint: Secondary | ICD-10-CM | POA: Diagnosis not present

## 2017-10-21 DIAGNOSIS — R918 Other nonspecific abnormal finding of lung field: Secondary | ICD-10-CM | POA: Diagnosis not present

## 2017-10-21 DIAGNOSIS — M349 Systemic sclerosis, unspecified: Secondary | ICD-10-CM | POA: Diagnosis not present

## 2017-10-21 DIAGNOSIS — R768 Other specified abnormal immunological findings in serum: Secondary | ICD-10-CM | POA: Diagnosis not present

## 2017-10-21 DIAGNOSIS — D8989 Other specified disorders involving the immune mechanism, not elsewhere classified: Secondary | ICD-10-CM | POA: Diagnosis not present

## 2017-10-21 DIAGNOSIS — I7300 Raynaud's syndrome without gangrene: Secondary | ICD-10-CM | POA: Diagnosis not present

## 2017-10-21 DIAGNOSIS — G5600 Carpal tunnel syndrome, unspecified upper limb: Secondary | ICD-10-CM | POA: Diagnosis not present

## 2017-10-23 DIAGNOSIS — Z23 Encounter for immunization: Secondary | ICD-10-CM

## 2017-10-23 DIAGNOSIS — Z1211 Encounter for screening for malignant neoplasm of colon: Secondary | ICD-10-CM

## 2017-11-04 DIAGNOSIS — D0592 Unspecified type of carcinoma in situ of left breast: Secondary | ICD-10-CM | POA: Diagnosis not present

## 2017-11-04 DIAGNOSIS — C50911 Malignant neoplasm of unspecified site of right female breast: Secondary | ICD-10-CM | POA: Diagnosis not present

## 2017-11-05 DIAGNOSIS — G5600 Carpal tunnel syndrome, unspecified upper limb: Secondary | ICD-10-CM | POA: Diagnosis not present

## 2017-11-05 DIAGNOSIS — M2550 Pain in unspecified joint: Secondary | ICD-10-CM | POA: Diagnosis not present

## 2017-11-05 DIAGNOSIS — I7300 Raynaud's syndrome without gangrene: Secondary | ICD-10-CM | POA: Diagnosis not present

## 2017-11-05 DIAGNOSIS — M349 Systemic sclerosis, unspecified: Secondary | ICD-10-CM | POA: Diagnosis not present

## 2017-11-05 DIAGNOSIS — R768 Other specified abnormal immunological findings in serum: Secondary | ICD-10-CM | POA: Diagnosis not present

## 2017-11-06 ENCOUNTER — Telehealth: Payer: Self-pay

## 2017-11-06 ENCOUNTER — Other Ambulatory Visit: Payer: Self-pay | Admitting: Rheumatology

## 2017-11-06 NOTE — Telephone Encounter (Signed)
Patient would like to know if you could fill out her disability form/ Would like a call to discuss.

## 2017-11-07 ENCOUNTER — Other Ambulatory Visit: Payer: Self-pay | Admitting: Rheumatology

## 2017-11-07 DIAGNOSIS — R760 Raised antibody titer: Secondary | ICD-10-CM | POA: Diagnosis not present

## 2017-11-07 DIAGNOSIS — M349 Systemic sclerosis, unspecified: Secondary | ICD-10-CM

## 2017-11-07 DIAGNOSIS — Z79899 Other long term (current) drug therapy: Secondary | ICD-10-CM | POA: Diagnosis not present

## 2017-11-07 DIAGNOSIS — Z5181 Encounter for therapeutic drug level monitoring: Secondary | ICD-10-CM | POA: Diagnosis not present

## 2017-11-07 DIAGNOSIS — M3489 Other systemic sclerosis: Secondary | ICD-10-CM | POA: Diagnosis not present

## 2017-11-09 ENCOUNTER — Other Ambulatory Visit: Payer: Self-pay | Admitting: Internal Medicine

## 2017-11-10 DIAGNOSIS — M349 Systemic sclerosis, unspecified: Secondary | ICD-10-CM | POA: Diagnosis not present

## 2017-11-10 DIAGNOSIS — Z1211 Encounter for screening for malignant neoplasm of colon: Secondary | ICD-10-CM | POA: Diagnosis not present

## 2017-11-10 DIAGNOSIS — R197 Diarrhea, unspecified: Secondary | ICD-10-CM | POA: Diagnosis not present

## 2017-11-11 ENCOUNTER — Other Ambulatory Visit (HOSPITAL_COMMUNITY): Payer: Self-pay | Admitting: Respiratory Therapy

## 2017-11-11 DIAGNOSIS — M349 Systemic sclerosis, unspecified: Secondary | ICD-10-CM

## 2017-11-12 ENCOUNTER — Ambulatory Visit: Payer: Medicare Other | Admitting: Internal Medicine

## 2017-11-12 DIAGNOSIS — M3489 Other systemic sclerosis: Secondary | ICD-10-CM | POA: Diagnosis not present

## 2017-11-12 DIAGNOSIS — M349 Systemic sclerosis, unspecified: Secondary | ICD-10-CM | POA: Diagnosis not present

## 2017-11-12 DIAGNOSIS — Z79899 Other long term (current) drug therapy: Secondary | ICD-10-CM | POA: Diagnosis not present

## 2017-11-12 DIAGNOSIS — Z5181 Encounter for therapeutic drug level monitoring: Secondary | ICD-10-CM | POA: Diagnosis not present

## 2017-11-12 DIAGNOSIS — Z853 Personal history of malignant neoplasm of breast: Secondary | ICD-10-CM | POA: Diagnosis not present

## 2017-11-13 ENCOUNTER — Ambulatory Visit: Payer: Medicare Other | Admitting: Internal Medicine

## 2017-11-13 ENCOUNTER — Encounter: Payer: Self-pay | Admitting: Internal Medicine

## 2017-11-13 DIAGNOSIS — Z0289 Encounter for other administrative examinations: Secondary | ICD-10-CM | POA: Diagnosis not present

## 2017-11-13 DIAGNOSIS — M349 Systemic sclerosis, unspecified: Secondary | ICD-10-CM | POA: Diagnosis not present

## 2017-11-13 NOTE — Progress Notes (Addendum)
Subjective:     Patient ID: Brianna Jones, female   DOB: [birth year], 67 y.o.   MRN: [id number] CC- "I needs disability form filled out"  HPI Pt is here to have me fill out disability forms for her. She has been diagnosed by Rheumatologist with Scleroderma. She was off work for 2 weeks then returned to work but could only work 4 days out of the week, and gradually has had to decrease it since since she cant endure the long days and her edema gets worse. She cant endure working 2 days in a row. The first rheumatologist she saw told her pt should not work at all. She will be starting Methotrexate in the next week. She will be seeing multiple specialist in the near future to see if its affecting her organs.   Past Medical History:  Diagnosis Date  . Arthritis   . Breast cancer of upper-inner quadrant of right female breast ([place]) 01/11/2015  . Hypertension   . Neuropathy    rt foot  . Personal history of radiation therapy   . Radiation 04/17/15-05/25/15   right breast 50.4 Gy, left breast 50.4 Gy    Patient has no known allergies.  Outpatient Medications Prior to Visit  Medication Sig Dispense Refill  . amLODipine (NORVASC) 5 MG tablet TAKE 1/2 TABLET X 3 DAYS THEN INCREASE TO 1 TABLET ONCE A DAY  0  . anastrozole (ARIMIDEX) 1 MG tablet Take 1 tablet (1 mg total) by mouth daily. 90 tablet 3  . Cholecalciferol (VITAMIN D3) 5000 UNITS CAPS Take 1 capsule by mouth daily. Reported on 03/15/2015    . hydroxychloroquine (PLAQUENIL) 200 MG tablet TAKE 1 TABLET ONCE A DAY FOR 2 WEEKS THEN INCREASE TO 2 TABLETS ONCE A DAY    . naproxen sodium (ALEVE) 220 MG tablet Take 440 mg by mouth at bedtime as needed (pain).    Marland Kitchen omeprazole (PRILOSEC) 20 MG capsule Take by mouth.    . telmisartan-hydrochlorothiazide (MICARDIS HCT) 80-25 MG tablet Take 1 tablet by mouth daily.  2  . amLODipine (NORVASC) 10 MG tablet Take 10 mg by mouth daily.    . Flaxseed, Linseed, (FLAX SEEDS PO) Take 1 tablet by mouth  daily.    . meloxicam (MOBIC) 15 MG tablet Take 15 mg by mouth daily.    Renda Rolls AD 4-10-325 MG TABS Take 1 tablet by mouth 2 (two) times daily.  0  . omega-3 acid ethyl esters (LOVAZA) 1 g capsule Take by mouth 2 (two) times daily.    . predniSONE (DELTASONE) 20 MG tablet Take 60 mg daily x 2 days then 40 mg daily x 2 days then 20 mg daily x 2 days 12 tablet 0  . ranitidine (ZANTAC) 150 MG tablet TAKE 1 TABLET BY MOUTH TWICE A DAY 60 tablet 1  . traMADol (ULTRAM) 50 MG tablet Take 1 tablet (50 mg total) by mouth every 6 (six) hours as needed for moderate pain. 30 tablet 0   No facility-administered medications prior to visit.     Review of Systems- edema of legs and still has swelling of hands. Gets very fatigued specially at the end of her job.      Objective:   Physical Exam Today's Vitals   11/13/17 1421  BP: 112/60  Temp: 98.1 F (36.7 C)  Weight: 148 lb 3.2 oz (67.2 kg)  Height: 5\' 3"  (1.6 m)   Body mass index is 26.25 kg/m.   Constitutional: She is oriented to person,  place, and time. She appears well-developed and well-nourished. No distress.  HENT:  Head: Normocephalic and atraumatic.  Right Ear: External ear normal.  Left Ear: External ear normal.  Nose: Nose normal.  Eyes: Conjunctivae are normal. Right eye exhibits no discharge. Left eye exhibits no discharge. No scleral icterus.  Neck: Neck supple. No thyromegaly present.  No carotid bruits bilaterally  Cardiovascular: Normal rate and regular rhythm. Pulmonary/Chest: Effort normal and breath sounds normal. No respiratory distress.  Musculoskeletal: Normal range of motion. She exhibits +1/4 lower leg edema. Her hands still look a little swollen.  Lymphadenopathy:    She has no cervical adenopathy.  Neurological: She is alert and oriented to person, place, and time.  Skin: Skin is warm and dry. Capillary refill takes less than 2 seconds. She is not diaphoretic.  Psychiatric: She has a normal mood and affect. Her  behavior is normal. Judgment and thought content normal.  Nursing note reviewed.     Assessment:    1. Scleroderma ([place])- new onset. Will continue following up  With rheumatologist.     Plan:     I spent 20 minutes looking through her records as I filled out the paper work she needed me to fill for her.

## 2017-11-18 ENCOUNTER — Other Ambulatory Visit: Payer: Self-pay | Admitting: Internal Medicine

## 2017-11-28 DIAGNOSIS — M349 Systemic sclerosis, unspecified: Secondary | ICD-10-CM | POA: Diagnosis not present

## 2017-11-28 DIAGNOSIS — M3481 Systemic sclerosis with lung involvement: Secondary | ICD-10-CM | POA: Diagnosis not present

## 2017-11-28 DIAGNOSIS — R760 Raised antibody titer: Secondary | ICD-10-CM | POA: Diagnosis not present

## 2017-12-02 DIAGNOSIS — D123 Benign neoplasm of transverse colon: Secondary | ICD-10-CM | POA: Diagnosis not present

## 2017-12-02 DIAGNOSIS — K5730 Diverticulosis of large intestine without perforation or abscess without bleeding: Secondary | ICD-10-CM | POA: Diagnosis not present

## 2017-12-02 DIAGNOSIS — R197 Diarrhea, unspecified: Secondary | ICD-10-CM | POA: Diagnosis not present

## 2017-12-02 DIAGNOSIS — K635 Polyp of colon: Secondary | ICD-10-CM | POA: Diagnosis not present

## 2017-12-02 DIAGNOSIS — K6389 Other specified diseases of intestine: Secondary | ICD-10-CM | POA: Diagnosis not present

## 2017-12-02 LAB — HM COLONOSCOPY

## 2017-12-03 ENCOUNTER — Encounter: Payer: Self-pay | Admitting: Internal Medicine

## 2017-12-03 ENCOUNTER — Ambulatory Visit: Payer: Medicare Other | Admitting: Internal Medicine

## 2017-12-03 VITALS — BP 110/82 | HR 78 | Temp 98.1°F | Ht 61.5 in | Wt 137.4 lb

## 2017-12-03 DIAGNOSIS — M349 Systemic sclerosis, unspecified: Secondary | ICD-10-CM

## 2017-12-03 DIAGNOSIS — I10 Essential (primary) hypertension: Secondary | ICD-10-CM | POA: Diagnosis not present

## 2017-12-03 DIAGNOSIS — Z0000 Encounter for general adult medical examination without abnormal findings: Secondary | ICD-10-CM

## 2017-12-03 DIAGNOSIS — R634 Abnormal weight loss: Secondary | ICD-10-CM | POA: Diagnosis not present

## 2017-12-03 LAB — POCT URINALYSIS DIPSTICK
Bilirubin, UA: NEGATIVE
Blood, UA: NEGATIVE
Glucose, UA: NEGATIVE
Ketones, UA: NEGATIVE
Leukocytes, UA: NEGATIVE
Nitrite, UA: NEGATIVE
Protein, UA: POSITIVE — AB
Spec Grav, UA: >=1.03 — AB (ref 1.010–1.025)
Urobilinogen, UA: 0.2 E.U./dL
pH, UA: 6 (ref 5.0–8.0)

## 2017-12-03 LAB — POCT UA - MICROALBUMIN
Creatinine, POC: 200 mg/dL
Microalbumin Ur, POC: 80 mg/L

## 2017-12-03 LAB — CMP14+EGFR
ALT: 8 IU/L (ref 0–32)
AST: 16 IU/L (ref 0–40)
Albumin/Globulin Ratio: 0.7 — ABNORMAL LOW (ref 1.2–2.2)
Albumin: 3.1 g/dL — ABNORMAL LOW (ref 3.6–4.8)
Alkaline Phosphatase: 55 IU/L (ref 39–117)
BUN/Creatinine Ratio: 7 — ABNORMAL LOW (ref 12–28)
BUN: 5 mg/dL — ABNORMAL LOW (ref 8–27)
Bilirubin Total: 0.4 mg/dL (ref 0.0–1.2)
CO2: 25 mmol/L (ref 20–29)
Calcium: 10.6 mg/dL — ABNORMAL HIGH (ref 8.7–10.3)
Chloride: 95 mmol/L — ABNORMAL LOW (ref 96–106)
Creatinine, Ser: 0.72 mg/dL (ref 0.57–1.00)
GFR calc Af Amer: 99 mL/min/{1.73_m2} (ref >59)
GFR calc non Af Amer: 86 mL/min/{1.73_m2} (ref >59)
Globulin, Total: 4.2 g/dL (ref 1.5–4.5)
Glucose: 87 mg/dL (ref 65–99)
Potassium: 3.8 mmol/L (ref 3.5–5.2)
Sodium: 132 mmol/L — ABNORMAL LOW (ref 134–144)
Total Protein: 7.3 g/dL (ref 6.0–8.5)

## 2017-12-03 LAB — LIPID PANEL
Chol/HDL Ratio: 6.2 ratio — ABNORMAL HIGH (ref 0.0–4.4)
Cholesterol, Total: 197 mg/dL (ref 100–199)
HDL: 32 mg/dL — ABNORMAL LOW (ref >39)
LDL Calculated: 136 mg/dL — ABNORMAL HIGH (ref 0–99)
Triglycerides: 147 mg/dL (ref 0–149)
VLDL Cholesterol Cal: 29 mg/dL (ref 5–40)

## 2017-12-03 NOTE — Patient Instructions (Addendum)

## 2017-12-07 NOTE — Progress Notes (Signed)
Your LDL, bad chol is 136. Ideally, this should be less than 100  Try to avoid fried foods. Your sodium level is low. This can contribute to fatigue. I suggest you slightly increase your salt intake. Your calcium level is sl. Elevated . I am sorry to see you move, but I do know that it is important for you to be with family. It has truly been a pleasure and thank you for allowing me to be your healthcare partner.

## 2017-12-08 DIAGNOSIS — M349 Systemic sclerosis, unspecified: Secondary | ICD-10-CM | POA: Diagnosis not present

## 2017-12-08 DIAGNOSIS — Z5181 Encounter for therapeutic drug level monitoring: Secondary | ICD-10-CM | POA: Diagnosis not present

## 2017-12-08 DIAGNOSIS — Z79899 Other long term (current) drug therapy: Secondary | ICD-10-CM | POA: Diagnosis not present

## 2017-12-22 DIAGNOSIS — Z79899 Other long term (current) drug therapy: Secondary | ICD-10-CM | POA: Diagnosis not present

## 2017-12-22 DIAGNOSIS — M349 Systemic sclerosis, unspecified: Secondary | ICD-10-CM | POA: Diagnosis not present

## 2017-12-22 DIAGNOSIS — Z5181 Encounter for therapeutic drug level monitoring: Secondary | ICD-10-CM | POA: Diagnosis not present

## 2017-12-31 ENCOUNTER — Ambulatory Visit
Admission: RE | Admit: 2017-12-31 | Discharge: 2017-12-31 | Disposition: A | Discharge status: Home / Self Care | Payer: Medicare Other | Source: Ambulatory Visit | Attending: Surgery | Admitting: Surgery

## 2017-12-31 DIAGNOSIS — R921 Mammographic calcification found on diagnostic imaging of breast: Secondary | ICD-10-CM

## 2018-01-09 DIAGNOSIS — M79672 Pain in left foot: Secondary | ICD-10-CM | POA: Diagnosis not present

## 2018-01-09 DIAGNOSIS — M349 Systemic sclerosis, unspecified: Secondary | ICD-10-CM | POA: Diagnosis not present

## 2018-01-09 DIAGNOSIS — R760 Raised antibody titer: Secondary | ICD-10-CM | POA: Diagnosis not present

## 2018-01-09 DIAGNOSIS — Z79899 Other long term (current) drug therapy: Secondary | ICD-10-CM | POA: Diagnosis not present

## 2018-01-09 DIAGNOSIS — M3489 Other systemic sclerosis: Secondary | ICD-10-CM | POA: Diagnosis not present

## 2018-01-10 DIAGNOSIS — I10 Essential (primary) hypertension: Secondary | ICD-10-CM | POA: Insufficient documentation

## 2018-01-10 DIAGNOSIS — R634 Abnormal weight loss: Secondary | ICD-10-CM | POA: Insufficient documentation

## 2018-01-10 NOTE — Progress Notes (Signed)
Subjective:     Patient ID: Brianna Jones , female    DOB: 1950-06-29 , 67 y.o.   MRN: [id number]   Chief Complaint  Patient presents with  . Annual Exam  . Hypertension    HPI  She is here today for a full physical examination. She reports that she will be moving to [place] with her daughter and SIL. Her daughter wants her to be closer to her. She is in agreement because it is now more difficult for her to do things for herself.   Hypertension  This is a chronic problem. The current episode started more than 1 year ago. The problem has been gradually improving since onset. The problem is controlled.   She admits she is not as active as she once was. She reports compliance with meds. She denies chest pain, headaches and palpitations.   Past Medical History:  Diagnosis Date  . Arthritis   . Breast cancer of upper-inner quadrant of right female breast ([place]) 01/11/2015  . Hypertension   . Neuropathy    rt foot  . Personal history of radiation therapy   . Radiation 04/17/15-05/25/15   right breast 50.4 Gy, left breast 50.4 Gy     Family History  Problem Relation Age of Onset  . Diabetes Mother   . Heart disease Mother   . Hyperlipidemia Mother   . Hypertension Mother   . Heart disease Father   . Hypertension Father   . Diabetes Brother   . Lung cancer Maternal Grandmother      Current Outpatient Medications:  .  amLODipine (NORVASC) 5 MG tablet, TAKE 1/2 TABLET X 3 DAYS THEN INCREASE TO 1 TABLET ONCE A DAY, Disp: , Rfl: 0 .  anastrozole (ARIMIDEX) 1 MG tablet, Take 1 tablet (1 mg total) by mouth daily., Disp: 90 tablet, Rfl: 3 .  Cholecalciferol (VITAMIN D3) 5000 UNITS CAPS, Take 1 capsule by mouth daily. Reported on 03/15/2015, Disp: , Rfl:  .  folic acid (FOLVITE) 1 MG tablet, Take 1 mg by mouth daily., Disp: , Rfl:  .  hydroxychloroquine (PLAQUENIL) 200 MG tablet, TAKE 1 TABLET ONCE A DAY FOR 2 WEEKS THEN INCREASE TO 2 TABLETS ONCE A DAY, Disp: , Rfl:  .   methotrexate (RHEUMATREX) 2.5 MG tablet, Take 10 mg by mouth once a week. Caution:Chemotherapy. Protect from light., Disp: , Rfl:  .  naproxen sodium (ALEVE) 220 MG tablet, Take 440 mg by mouth at bedtime as needed (pain)., Disp: , Rfl:  .  ranitidine (ZANTAC) 150 MG tablet, TAKE 1 TABLET BY MOUTH TWICE A DAY, Disp: 60 tablet, Rfl: 1 .  telmisartan-hydrochlorothiazide (MICARDIS HCT) 80-25 MG tablet, Take 1 tablet by mouth daily., Disp: , Rfl: 2   No Known Allergies   Review of Systems  Constitutional: Positive for fatigue and unexpected weight change.  HENT: Negative.   Eyes: Negative.   Respiratory: Negative.   Cardiovascular: Negative.   Gastrointestinal: Negative.   Endocrine: Negative.   Genitourinary: Negative.   Neurological: Negative.   Psychiatric/Behavioral: Negative.      Today's Vitals   12/03/17 0910  BP: 110/82  Pulse: 78  Temp: 98.1 F (36.7 C)  TempSrc: Oral  Weight: 137 lb 6.4 oz (62.3 kg)  Height: 5' 1.5" (1.562 m)  PainSc: 0-No pain   Body mass index is 25.54 kg/m.     No LMP recorded. Patient is postmenopausal.. . Negative for: breast discharge, breast lump(s), breast pain and breast self exam. Associated symptoms include  abnormal vaginal bleeding. Pertinent negatives include abnormal bleeding (hematology), anxiety, decreased libido, depression, difficulty falling sleep, dyspareunia, history of infertility, nocturia, sexual dysfunction, sleep disturbances, urinary incontinence, urinary urgency, vaginal discharge and vaginal itching. Diet regular.The patient states her exercise level is  minimal.   . The patient's tobacco use is:  Social History   Tobacco Use  Smoking Status Never Smoker  Smokeless Tobacco Never Used  . She has been exposed to passive smoke. The patient's alcohol use is:  Social History   Substance and Sexual Activity  Alcohol Use Yes  . Alcohol/week: 0.0 standard drinks   Comment: 2-3   Objective:  Physical Exam Vitals signs and  nursing note reviewed.  Constitutional:      Appearance: Normal appearance. She is normal weight.  HENT:     Head: Normocephalic and atraumatic.     Right Ear: Tympanic membrane, ear canal and external ear normal.     Left Ear: Tympanic membrane, ear canal and external ear normal.     Nose: Nose normal.     Mouth/Throat:     Mouth: Mucous membranes are dry.  Neck:     Musculoskeletal: Normal range of motion.  Cardiovascular:     Rate and Rhythm: Normal rate and regular rhythm.     Heart sounds: Normal heart sounds.  Pulmonary:     Effort: Pulmonary effort is normal.     Breath sounds: Normal breath sounds.  Chest:     Breasts:        Right: Normal. No mass or nipple discharge.        Left: Normal. No mass or nipple discharge.  Abdominal:     General: Bowel sounds are normal.     Palpations: Abdomen is soft.  Genitourinary:    Comments: deferred Skin:    General: Skin is warm.  Neurological:     General: No focal deficit present.     Mental Status: She is alert and oriented to person, place, and time.  Psychiatric:        Mood and Affect: Mood normal.        Behavior: Behavior normal.         Assessment And Plan:     1. Routine general medical examination at health care facility  A full exam was performed. Importance of monthly self breast exams was discussed with the patient.  PATIENT HAS BEEN ADVISED TO GET 30-45 MINUTES REGULAR EXERCISE NO LESS THAN FOUR TO FIVE DAYS PER WEEK - BOTH WEIGHTBEARING EXERCISES AND AEROBIC ARE RECOMMENDED.  SHE IS ADVISED TO FOLLOW A HEALTHY DIET WITH AT LEAST SIX FRUITS/VEGGIES PER DAY, DECREASE INTAKE OF RED MEAT, AND TO INCREASE FISH INTAKE TO TWO DAYS PER WEEK.  MEATS/FISH SHOULD NOT BE FRIED, BAKED OR BROILED IS PREFERABLE.  I SUGGEST WEARING SPF 50 SUNSCREEN ON EXPOSED PARTS AND ESPECIALLY WHEN IN THE DIRECT SUNLIGHT FOR AN EXTENDED PERIOD OF TIME.  PLEASE AVOID FAST FOOD RESTAURANTS AND INCREASE YOUR WATER INTAKE.   2. Essential  hypertension, benign  Well controlled. She will continue with current meds. EKG not performed, one was done earlier this year. She is encouraged to avoid adding salt to her foods.   - Lipid Profile - CMP14+EGFR - POCT Urinalysis Dipstick (81002) - POCT UA - Microalbumin  3. Scleroderma ([place])  Newly diagnosed. She is now followed by Rheumatology at [facility].   4. Weight loss  Likely related to decreased po intake. She is encouraged to discuss this further with  her new physician if she loses another five pounds. She is currently at a healthy BMI.         Maximino Greenland, MD

## 2018-01-23 DIAGNOSIS — M349 Systemic sclerosis, unspecified: Secondary | ICD-10-CM | POA: Diagnosis not present

## 2018-01-23 DIAGNOSIS — R760 Raised antibody titer: Secondary | ICD-10-CM | POA: Diagnosis not present

## 2018-01-23 DIAGNOSIS — R7989 Other specified abnormal findings of blood chemistry: Secondary | ICD-10-CM | POA: Diagnosis not present

## 2018-03-18 NOTE — Progress Notes (Signed)
Patient Care Team: Glendale Chard, MD as PCP - General (Internal Medicine) Alphonsa Overall, MD as Consulting Physician (General Surgery) Nicholas Lose, MD as Consulting Physician (Hematology and Oncology) Gery Pray, MD as Consulting Physician (Radiation Oncology) Sylvan Cheese, NP as Nurse Practitioner (Hematology and Oncology) Irene Limbo, MD as Consulting Physician (Plastic Surgery)  DIAGNOSIS:    ICD-10-CM   1. Malignant neoplasm of upper-inner quadrant of right breast in female, estrogen receptor positive ([place]) C50.211    Z17.0     SUMMARY OF ONCOLOGIC HISTORY:   Breast cancer of upper-inner quadrant of right female breast ([place])   12/30/2014 Mammogram    Right breast mass at 12:30: 1 x 0.6 x 1.1 cm; left breast calcifications spanning 4 cm with a titer group measuring 5 mm, no pathologic lymph nodes    01/06/2015 Initial Diagnosis    RIGHT breast biopsy: 12:30: Invasive lobular carcinoma grade 1, ER 95%, PR 0%, insufficient for HER2 or Ki-67; LCIS with necrosis and calcifications; LEFT breast biopsy upper outer quadrant: DCIS with necrosis and calcifications, ER 100%, PR 80%    02/23/2015 Surgery    Right lumpectomy: ALH, LCIS, 0/1 lymph node    04/17/2015 - 05/25/2015 Radiation Therapy    Adjuvant radiation therapy ([facility]ard): Right breast was treated to 50.4 Gy in 28 fractions at 1.8 Gy per fraction.  Left breast was treated to 50.4 Gy in 28 fractions at 1.8 Gy per fraction.    06/22/2015 -  Anti-estrogen oral therapy    Anastrozole 1 mg daily     Breast cancer of upper-outer quadrant of left female breast ([place])   02/23/2015 Surgery    Left lumpectomy [name]): DCIS with calcifications, 4 mm margin, ER 95%, PR 80%, Tis N0 stage 0    04/17/2015 - 05/25/2015 Radiation Therapy    Adjuvant radiation therapy ([facility]): Right breast was treated to 50.4 Gy in 28 fractions at 1.8 Gy per fraction.  Left breast was treated to 50.4 Gy in 28 fractions at 1.8 Gy per  fraction.    06/22/2015 -  Anti-estrogen oral therapy    Anastrozole 1 mg daily     CHIEF COMPLIANT: Follow-up on anastrozole therapy  INTERVAL HISTORY: Brianna Jones is a 68 y.o. with above-mentioned history of bilateral breast cancers, invasive lobular cancer of the right breast and DCIS of the left breast. She underwent bilateral lumpectomies and radiation and is currently on anastrozole therapy. I last saw the patient one year ago. Her most recent mammogram on 12/31/17 showed stable, probably benign left breast calcifications in the left breast and no evidence of malignancy in the right. She presents to the clinic alone today. She was recently diagnosed with scleredema and takes methotrexate and folic acid. She is now fully retired and exercises regularly. She moved to [facility] to live with her daughter. She reviewed her medication list with me.   REVIEW OF SYSTEMS:   Constitutional: Denies fevers, chills or abnormal weight loss Eyes: Denies blurriness of vision Ears, nose, mouth, throat, and face: Denies mucositis or sore throat Respiratory: Denies cough, dyspnea or wheezes Cardiovascular: Denies palpitation, chest discomfort Gastrointestinal: Denies nausea, heartburn or change in bowel habits Skin: (+) scleredema Lymphatics: Denies new lymphadenopathy or easy bruising Neurological: Denies numbness, tingling or new weaknesses Behavioral/Psych: Mood is stable, no new changes  Extremities: No lower extremity edema Breast: denies any pain or lumps or nodules in either breasts All other systems were reviewed with the patient and are negative.  I have reviewed the  past medical history, past surgical history, social history and family history with the patient and they are unchanged from previous note.  ALLERGIES:  has No Known Allergies.  MEDICATIONS:  Current Outpatient Medications  Medication Sig Dispense Refill  . amLODipine (NORVASC) 2.5 MG tablet Take 2 tablets (5 mg  total) by mouth daily.    Marland Kitchen anastrozole (ARIMIDEX) 1 MG tablet Take 1 tablet (1 mg total) by mouth daily. 90 tablet 3  . Cholecalciferol (VITAMIN D3) 5000 UNITS CAPS Take 1 capsule by mouth daily. Reported on 4/74/2595    . folic acid (FOLVITE) 1 MG tablet Take 1 mg by mouth daily.    . hydroxychloroquine (PLAQUENIL) 200 MG tablet Alternate between 2 tabs one day and 1 tab other day    . methotrexate (RHEUMATREX) 2.5 MG tablet Take 10 mg by mouth once a week. Caution:Chemotherapy. Protect from light.    . naproxen sodium (ALEVE) 220 MG tablet Take 440 mg by mouth at bedtime as needed (pain).    Marland Kitchen omeprazole (PRILOSEC) 20 MG capsule Take 1 capsule (20 mg total) by mouth daily.    Marland Kitchen telmisartan-hydrochlorothiazide (MICARDIS HCT) 80-25 MG tablet Take 1 tablet by mouth daily.  2   No current facility-administered medications for this visit.     PHYSICAL EXAMINATION: ECOG PERFORMANCE STATUS: 1 - Symptomatic but completely ambulatory  Vitals:   03/19/18 1021  BP: 119/73  Pulse: 94  Resp: 18  Temp: 98 F (36.7 C)  SpO2: 92%   Filed Weights   03/19/18 1021  Weight: 144 lb 3.2 oz (65.4 kg)    GENERAL: alert, no distress and comfortable SKIN: skin color, texture, turgor are normal, no rashes or significant lesions (+) scleredema EYES: normal, Conjunctiva are pink and non-injected, sclera clear OROPHARYNX: no exudate, no erythema and lips, buccal mucosa, and tongue normal  NECK: supple, thyroid normal size, non-tender, without nodularity LYMPH: no palpable lymphadenopathy in the cervical, axillary or inguinal LUNGS: clear to auscultation and percussion with normal breathing effort HEART: regular rate & rhythm and no murmurs and no lower extremity edema ABDOMEN: abdomen soft, non-tender and normal bowel sounds MUSCULOSKELETAL: no cyanosis of digits and no clubbing  NEURO: alert & oriented x 3 with fluent speech, no focal motor/sensory deficits EXTREMITIES: No lower extremity  edema BREAST: No palpable masses or nodules in either right or left breasts. No palpable axillary supraclavicular or infraclavicular adenopathy no breast tenderness or nipple discharge. (exam performed in the presence of a chaperone)   LABORATORY DATA:  I have reviewed the data as listed CMP Latest Ref Rng & Units 12/03/2017 06/25/2017 02/15/2015  Glucose 65 - 99 mg/dL 87 90 94  BUN 8 - 27 mg/dL 5(L) 13 15  Creatinine 0.57 - 1.00 mg/dL 0.72 0.76 0.84  Sodium 134 - 144 mmol/L 132(L) 139 140  Potassium 3.5 - 5.2 mmol/L 3.8 3.6 3.9  Chloride 96 - 106 mmol/L 95(L) 105 102  CO2 20 - 29 mmol/L _0 Calcium 8.7 - 10.3 mg/dL 10.6(H) 10.0 10.3  Total Protein 6.0 - 8.5 g/dL 7.3 7.0 6.9  Total Bilirubin 0.0 - 1.2 mg/dL 0.4 0.5 0.5  Alkaline Phos 39 - 117 IU/L 55 70 63  AST 0 - 40 IU/L _1 ALT 0 - 32 IU/L _2 Lab Results  Component Value Date   WBC 5.7 06/25/2017   HGB 14.5 06/25/2017   HCT 43.6 06/25/2017   MCV 86.5 06/25/2017   PLT 226  06/25/2017   NEUTROABS 3.7 06/25/2017    ASSESSMENT & PLAN:  Breast cancer of upper-inner quadrant of right female breast ([place]) RIGHT breast biopsy: 12:30: Invasive lobular carcinoma grade 1, ER 95%, PR 0% insufficient for her to our Ki-67; LCIS with necrosis and calcifications; LEFT breast biopsy upper outer quadrant: DCIS with necrosis and calcifications, ER 100%, PR 80% Right lumpectomy 02/23/2015: ALH, LCIS, 0/1 lymph node Left lumpectomy: DCIS ER PR positive Pathological staging: T1a N0 stage IA Adjuvant radiation therapy completed 05/25/2015  Current treatment: Adjuvant anastrozole 1 mg daily 5 years started 06/24/2015  Anastrozole toxicities: 1. Intermittent hot flashes 2. musculoskeletal stiffness The side effects are tolerable.  Scleroderma: Patient is currently on methotrexate and Plaquenil.  Surveillance: 1. Breast exam 03/19/2018: Benign 2. mammogram  01/20/2018: Stable 3 mm group of dystrophic calcifications in  the left breast,  breast density category B  Patient has retired and moved in with her daughter in [place]. She has a lot of time for exercises and spends time in [employer]. Return to clinic in 1 year for follow up    No orders of the defined types were placed in this encounter.  The patient has a good understanding of the overall plan. she agrees with it. she will call with any problems that may develop before the next visit here.  Nicholas Lose, MD 03/19/2018  Julious Oka Dorshimer am acting as scribe for Dr. Nicholas Lose.  I have reviewed the above documentation for accuracy and completeness, and I agree with the above.

## 2018-03-19 ENCOUNTER — Telehealth: Payer: Self-pay | Admitting: Hematology and Oncology

## 2018-03-19 ENCOUNTER — Inpatient Hospital Stay: Payer: Medicare HMO | Attending: Hematology and Oncology | Admitting: Hematology and Oncology

## 2018-03-19 DIAGNOSIS — Z79811 Long term (current) use of aromatase inhibitors: Secondary | ICD-10-CM | POA: Diagnosis not present

## 2018-03-19 DIAGNOSIS — Z170 Estrogen receptor positive status [ER+]: Secondary | ICD-10-CM | POA: Diagnosis not present

## 2018-03-19 DIAGNOSIS — C50211 Malignant neoplasm of upper-inner quadrant of right female breast: Secondary | ICD-10-CM | POA: Diagnosis present

## 2018-03-19 DIAGNOSIS — N951 Menopausal and female climacteric states: Secondary | ICD-10-CM

## 2018-03-19 DIAGNOSIS — Z923 Personal history of irradiation: Secondary | ICD-10-CM

## 2018-03-19 DIAGNOSIS — M349 Systemic sclerosis, unspecified: Secondary | ICD-10-CM

## 2018-03-19 DIAGNOSIS — C50412 Malignant neoplasm of upper-outer quadrant of left female breast: Secondary | ICD-10-CM

## 2018-03-19 DIAGNOSIS — Z79899 Other long term (current) drug therapy: Secondary | ICD-10-CM

## 2018-03-19 MED ORDER — ANASTROZOLE 1 MG PO TABS: 1.0000 mg | ORAL_TABLET | Freq: Every day | ORAL | 3 refills | Status: DC

## 2018-03-19 MED ORDER — AMLODIPINE BESYLATE 2.5 MG PO TABS: 5.0000 mg | ORAL_TABLET | Freq: Every day | ORAL | Status: AC

## 2018-03-19 MED ORDER — HYDROXYCHLOROQUINE SULFATE 200 MG PO TABS: ORAL_TABLET | ORAL | Status: AC

## 2018-03-19 MED ORDER — OMEPRAZOLE 20 MG PO CPDR: 20.0000 mg | DELAYED_RELEASE_CAPSULE | Freq: Every day | ORAL | Status: DC

## 2018-03-19 NOTE — Telephone Encounter (Signed)
Gave avs and calendar ° °

## 2018-03-19 NOTE — Assessment & Plan Note (Signed)
RIGHT breast biopsy: 12:30: Invasive lobular carcinoma grade 1, ER 95%, PR 0% insufficient for her to our Ki-67; LCIS with necrosis and calcifications; LEFT breast biopsy upper outer quadrant: DCIS with necrosis and calcifications, ER 100%, PR 80% Right lumpectomy 02/23/2015: ALH, LCIS, 0/1 lymph node Left lumpectomy: DCIS ER PR positive Pathological staging: T1a N0 stage IA Adjuvant radiation therapy completed 05/25/2015  Current treatment: Adjuvant anastrozole 1 mg daily 5 years started 06/24/2015  Anastrozole toxicities: 1. Intermittent hot flashes 2. musculoskeletal stiffness The side effects are tolerable.  Surveillance: 1. Breast exam 03/19/2018: Benign 2. 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 She tries to get some time for exercise. Return to clinic in 1 year for follow up

## 2018-03-30 ENCOUNTER — Other Ambulatory Visit: Payer: Self-pay | Admitting: Internal Medicine

## 2018-06-09 ENCOUNTER — Other Ambulatory Visit (HOSPITAL_COMMUNITY): Payer: Self-pay | Admitting: Respiratory Therapy

## 2018-06-09 DIAGNOSIS — M349 Systemic sclerosis, unspecified: Secondary | ICD-10-CM

## 2018-07-02 ENCOUNTER — Other Ambulatory Visit: Payer: Self-pay | Admitting: Internal Medicine

## 2018-10-28 ENCOUNTER — Ambulatory Visit: Payer: Medicare Other | Admitting: Internal Medicine

## 2018-10-28 ENCOUNTER — Ambulatory Visit: Payer: Medicare Other

## 2019-03-21 NOTE — Progress Notes (Signed)
Patient Care Team: Glendale Chard, MD as PCP - General (Internal Medicine) Alphonsa Overall, MD as Consulting Physician (General Surgery) Nicholas Lose, MD as Consulting Physician (Hematology and Oncology) Gery Pray, MD as Consulting Physician (Radiation Oncology) Sylvan Cheese, NP as Nurse Practitioner (Hematology and Oncology) Irene Limbo, MD as Consulting Physician (Plastic Surgery)  DIAGNOSIS:    ICD-10-CM   1. Malignant neoplasm of upper-inner quadrant of right breast in female, estrogen receptor positive ([place])  C50.211    Z17.0     SUMMARY OF ONCOLOGIC HISTORY: Oncology History  Breast cancer of upper-inner quadrant of right female breast ([place])  12/30/2014 Mammogram   Right breast mass at 12:30: 1 x 0.6 x 1.1 cm; left breast calcifications spanning 4 cm with a titer group measuring 5 mm, no pathologic lymph nodes   01/06/2015 Initial Diagnosis   RIGHT breast biopsy: 12:30: Invasive lobular carcinoma grade 1, ER 95%, PR 0%, insufficient for HER2 or Ki-67; LCIS with necrosis and calcifications; LEFT breast biopsy upper outer quadrant: DCIS with necrosis and calcifications, ER 100%, PR 80%   02/23/2015 Surgery   Right lumpectomy: ALH, LCIS, 0/1 lymph node   04/17/2015 - 05/25/2015 Radiation Therapy   Adjuvant radiation therapy ([facility]ard): Right breast was treated to 50.4 Gy in 28 fractions at 1.8 Gy per fraction.  Left breast was treated to 50.4 Gy in 28 fractions at 1.8 Gy per fraction.   06/22/2015 -  Anti-estrogen oral therapy   Anastrozole 1 mg daily   Breast cancer of upper-outer quadrant of left female breast ([place])  02/23/2015 Surgery   Left lumpectomy [name]): DCIS with calcifications, 4 mm margin, ER 95%, PR 80%, Tis N0 stage 0   04/17/2015 - 05/25/2015 Radiation Therapy   Adjuvant radiation therapy (Kinard): Right breast was treated to 50.4 Gy in 28 fractions at 1.8 Gy per fraction.  Left breast was treated to 50.4 Gy in 28 fractions at 1.8 Gy per  fraction.   06/22/2015 -  Anti-estrogen oral therapy   Anastrozole 1 mg daily     CHIEF COMPLIANT: Follow-up of bilateral breast cancers on anastrozole therapy  INTERVAL HISTORY: Brianna Jones is a 69 y.o. with above-mentioned history of invasive lobular cancer of the right breast and DCIS of the left breast.She underwent bilateral lumpectomies, radiation, and is currently on anastrozole. She presents to the clinic today for follow-up.   ALLERGIES:  has No Known Allergies.  MEDICATIONS:  Current Outpatient Medications  Medication Sig Dispense Refill  . amLODipine (NORVASC) 2.5 MG tablet Take 2 tablets (5 mg total) by mouth daily.    Marland Kitchen anastrozole (ARIMIDEX) 1 MG tablet Take 1 tablet (1 mg total) by mouth daily. 90 tablet 3  . Cholecalciferol (VITAMIN D3) 5000 UNITS CAPS Take 1 capsule by mouth daily. Reported on 1/61/0960    . folic acid (FOLVITE) 1 MG tablet Take 1 mg by mouth daily.    . hydroxychloroquine (PLAQUENIL) 200 MG tablet Alternate between 2 tabs one day and 1 tab other day    . methotrexate (RHEUMATREX) 2.5 MG tablet Take 10 mg by mouth once a week. Caution:Chemotherapy. Protect from light.    . naproxen sodium (ALEVE) 220 MG tablet Take 440 mg by mouth at bedtime as needed (pain).    Marland Kitchen omeprazole (PRILOSEC) 20 MG capsule Take 1 capsule (20 mg total) by mouth daily.    Marland Kitchen telmisartan-hydrochlorothiazide (MICARDIS HCT) 80-25 MG tablet TAKE 1 TABLET BY MOUTH EVERY DAY 90 tablet 0   No current facility-administered medications  for this visit.    PHYSICAL EXAMINATION: ECOG PERFORMANCE STATUS: 1 - Symptomatic but completely ambulatory  Vitals:   03/22/19 1007  BP: 138/80  Pulse: 98  Resp: 18  Temp: 99.1 F (37.3 C)  SpO2: 98%   Filed Weights   03/22/19 1007  Weight: 182 lb 12.8 oz (82.9 kg)    BREAST: No palpable masses or nodules in either right or left breasts. No palpable axillary supraclavicular or infraclavicular adenopathy no breast tenderness or nipple  discharge. (exam performed in the presence of a chaperone)  LABORATORY DATA:  I have reviewed the data as listed CMP Latest Ref Rng & Units 12/03/2017 06/25/2017 02/15/2015  Glucose 65 - 99 mg/dL 87 90 94  BUN 8 - 27 mg/dL 5(L) 13 15  Creatinine 0.57 - 1.00 mg/dL 0.72 0.76 0.84  Sodium 134 - 144 mmol/L 132(L) 139 140  Potassium 3.5 - 5.2 mmol/L 3.8 3.6 3.9  Chloride 96 - 106 mmol/L 95(L) 105 102  CO2 20 - 29 mmol/L '25 26 29  ' Calcium 8.7 - 10.3 mg/dL 10.6(H) 10.0 10.3  Total Protein 6.0 - 8.5 g/dL 7.3 7.0 6.9  Total Bilirubin 0.0 - 1.2 mg/dL 0.4 0.5 0.5  Alkaline Phos 39 - 117 IU/L 55 70 63  AST 0 - 40 IU/L '16 28 25  ' ALT 0 - 32 IU/L '8 25 27    ' Lab Results  Component Value Date   WBC 5.7 06/25/2017   HGB 14.5 06/25/2017   HCT 43.6 06/25/2017   MCV 86.5 06/25/2017   PLT 226 06/25/2017   NEUTROABS 3.7 06/25/2017    ASSESSMENT & PLAN:  Breast cancer of upper-inner quadrant of right female breast ([place]) RIGHT breast biopsy: 12:30: Invasive lobular carcinoma grade 1, ER 95%, PR 0% insufficient for her to our Ki-67; LCIS with necrosis and calcifications; LEFT breast biopsy upper outer quadrant: DCIS with necrosis and calcifications, ER 100%, PR 80% Right lumpectomy 02/23/2015: ALH, LCIS, 0/1 lymph node Left lumpectomy: DCIS ER PR positive Pathological staging: T1a N0 stage IA Adjuvant radiation therapy completed 05/25/2015  Current treatment: Adjuvant anastrozole 1 mg daily 5 years started 06/24/2015  Anastrozole toxicities: 1. Intermittent hot flashes: Stable 2. musculoskeletal stiffness: Improved  Scleroderma: Patient is currently on methotrexate and Plaquenil.  Surveillance: 1.Breast exam  03/22/2019: Benign 2.mammogram  December 2020 at the [facility] in [place]:  Benign  Patient has retired and moved in with her daughter in [place]. She has a lot of time for exercises and spends time in [employer]. Return to clinic in1 yearfor follow up     No orders of the defined types were placed in this encounter.  The patient has a good understanding of the overall plan. she agrees with it. she will call with any problems that may develop before the next visit here.  Total time spent: 20 mins including face to face time and time spent for planning, charting and coordination of care  Nicholas Lose, MD 03/22/2019  I, Cloyde Reams Dorshimer, am acting as scribe for Dr. Nicholas Lose.  I have reviewed the above documentation for accuracy and completeness, and I agree with the above.

## 2019-03-22 ENCOUNTER — Other Ambulatory Visit: Payer: Self-pay

## 2019-03-22 ENCOUNTER — Inpatient Hospital Stay: Payer: Medicare HMO | Attending: Hematology and Oncology | Admitting: Hematology and Oncology

## 2019-03-22 DIAGNOSIS — Z923 Personal history of irradiation: Secondary | ICD-10-CM | POA: Insufficient documentation

## 2019-03-22 DIAGNOSIS — Z170 Estrogen receptor positive status [ER+]: Secondary | ICD-10-CM | POA: Insufficient documentation

## 2019-03-22 DIAGNOSIS — C50211 Malignant neoplasm of upper-inner quadrant of right female breast: Secondary | ICD-10-CM | POA: Diagnosis not present

## 2019-03-22 DIAGNOSIS — Z79899 Other long term (current) drug therapy: Secondary | ICD-10-CM | POA: Diagnosis not present

## 2019-03-22 DIAGNOSIS — N951 Menopausal and female climacteric states: Secondary | ICD-10-CM | POA: Insufficient documentation

## 2019-03-22 DIAGNOSIS — Z79811 Long term (current) use of aromatase inhibitors: Secondary | ICD-10-CM | POA: Insufficient documentation

## 2019-03-22 DIAGNOSIS — C50412 Malignant neoplasm of upper-outer quadrant of left female breast: Secondary | ICD-10-CM | POA: Insufficient documentation

## 2019-03-22 DIAGNOSIS — M349 Systemic sclerosis, unspecified: Secondary | ICD-10-CM | POA: Diagnosis not present

## 2019-03-22 MED ORDER — ANASTROZOLE 1 MG PO TABS: 1.0000 mg | ORAL_TABLET | Freq: Every day | ORAL | 3 refills | Status: DC

## 2019-03-22 NOTE — Assessment & Plan Note (Signed)
RIGHT breast biopsy: 12:30: Invasive lobular carcinoma grade 1, ER 95%, PR 0% insufficient for her to our Ki-67; LCIS with necrosis and calcifications; LEFT breast biopsy upper outer quadrant: DCIS with necrosis and calcifications, ER 100%, PR 80% Right lumpectomy 02/23/2015: ALH, LCIS, 0/1 lymph node Left lumpectomy: DCIS ER PR positive Pathological staging: T1a N0 stage IA Adjuvant radiation therapy completed 05/25/2015  Current treatment: Adjuvant anastrozole 1 mg daily 5 years started 06/24/2015  Anastrozole toxicities: 1. Intermittent hot flashes: Stable 2. musculoskeletal stiffness: Improved  Scleroderma: Patient is currently on methotrexate and Plaquenil.  Surveillance: 1.Breast exam  03/22/2019: Benign 2.mammogram 01/20/2018: Stable 3 mm group of dystrophic calcifications in the left breast,  breast density category B  Patient has retired and moved in with her daughter in [place]. She has a lot of time for exercises and spends time in [employer]. Return to clinic in1 yearfor follow up

## 2019-03-23 ENCOUNTER — Telehealth: Payer: Self-pay | Admitting: Hematology and Oncology

## 2019-03-23 NOTE — Telephone Encounter (Signed)
I left a message regarding schedule  

## 2020-03-20 NOTE — Progress Notes (Signed)
Patient Care Team: Alphonsa Overall, MD as Consulting Physician (General Surgery) Nicholas Lose, MD as Consulting Physician (Hematology and Oncology) Gery Pray, MD as Consulting Physician (Radiation Oncology) Sylvan Cheese, NP as Nurse Practitioner (Hematology and Oncology) Irene Limbo, MD as Consulting Physician (Plastic Surgery)  DIAGNOSIS:    ICD-10-CM   1. Malignant neoplasm of upper-outer quadrant of left breast in female, estrogen receptor positive ([place])  C50.412    Z17.0     SUMMARY OF ONCOLOGIC HISTORY: Oncology History  Breast cancer of upper-inner quadrant of right female breast ([place])  12/30/2014 Mammogram   Right breast mass at 12:30: 1 x 0.6 x 1.1 cm; left breast calcifications spanning 4 cm with a titer group measuring 5 mm, no pathologic lymph nodes   01/06/2015 Initial Diagnosis   RIGHT breast biopsy: 12:30: Invasive lobular carcinoma grade 1, ER 95%, PR 0%, insufficient for HER2 or Ki-67; LCIS with necrosis and calcifications; LEFT breast biopsy upper outer quadrant: DCIS with necrosis and calcifications, ER 100%, PR 80%   02/23/2015 Surgery   Right lumpectomy: ALH, LCIS, 0/1 lymph node   04/17/2015 - 05/25/2015 Radiation Therapy   Adjuvant radiation therapy ([facility]): Right breast was treated to 50.4 Gy in 28 fractions at 1.8 Gy per fraction.  Left breast was treated to 50.4 Gy in 28 fractions at 1.8 Gy per fraction.   06/22/2015 -  Anti-estrogen oral therapy   Anastrozole 1 mg daily   Breast cancer of upper-outer quadrant of left female breast ([place])  02/23/2015 Surgery   Left lumpectomy [name]): DCIS with calcifications, 4 mm margin, ER 95%, PR 80%, Tis N0 stage 0   04/17/2015 - 05/25/2015 Radiation Therapy   Adjuvant radiation therapy ([facility]): Right breast was treated to 50.4 Gy in 28 fractions at 1.8 Gy per fraction.  Left breast was treated to 50.4 Gy in 28 fractions at 1.8 Gy per fraction.   06/22/2015 -  Anti-estrogen oral therapy    Anastrozole 1 mg daily     CHIEF COMPLIANT: Follow-up of bilateral breast cancers on anastrozole therapy  INTERVAL HISTORY: Brianna Jones is a 70 y.o. with above-mentioned history of invasive lobular cancer of the right breast andDCISof the left breast.She underwent bilateral lumpectomies, radiation, and is currently on anastrozole. Mammogram on 01/10/20 showed no evidence of malignancy bilaterally. She presents to the clinictoday for follow-up.   She has been tolerating anastrozole fairly well.  She would like to discontinue it at this time.  ALLERGIES:  has No Known Allergies.  MEDICATIONS:  Current Outpatient Medications  Medication Sig Dispense Refill   amLODipine (NORVASC) 2.5 MG tablet Take 2 tablets (5 mg total) by mouth daily.     Cholecalciferol (VITAMIN D3) 5000 UNITS CAPS Take 1 capsule by mouth daily. Reported on 03/15/2015     hydroxychloroquine (PLAQUENIL) 200 MG tablet Alternate between 2 tabs one day and 1 tab other day     methotrexate (RHEUMATREX) 2.5 MG tablet Take 10 mg by mouth once a week. Caution:Chemotherapy. Protect from light.     naproxen sodium (ALEVE) 220 MG tablet Take 440 mg by mouth at bedtime as needed (pain).     telmisartan-hydrochlorothiazide (MICARDIS HCT) 80-25 MG tablet TAKE 1 TABLET BY MOUTH EVERY DAY 90 tablet 0   No current facility-administered medications for this visit.    PHYSICAL EXAMINATION: ECOG PERFORMANCE STATUS: 1 - Symptomatic but completely ambulatory  Vitals:   03/21/20 1015  BP: 125/69  Pulse: 88  Resp: 17  Temp: 97.9 F (36.6 C)  SpO2: 99%   Filed Weights   03/21/20 1015  Weight: 182 lb (82.6 kg)      LABORATORY DATA:  I have reviewed the data as listed CMP Latest Ref Rng & Units 12/03/2017 06/25/2017 02/15/2015  Glucose 65 - 99 mg/dL 87 90 94  BUN 8 - 27 mg/dL 5(L) 13 15  Creatinine 0.57 - 1.00 mg/dL 0.72 0.76 0.84  Sodium 134 - 144 mmol/L 132(L) 139 140  Potassium 3.5 - 5.2 mmol/L 3.8 3.6 3.9   Chloride 96 - 106 mmol/L 95(L) 105 102  CO2 20 - 29 mmol/L _0 Calcium 8.7 - 10.3 mg/dL 10.6(H) 10.0 10.3  Total Protein 6.0 - 8.5 g/dL 7.3 7.0 6.9  Total Bilirubin 0.0 - 1.2 mg/dL 0.4 0.5 0.5  Alkaline Phos 39 - 117 IU/L 55 70 63  AST 0 - 40 IU/L _1 ALT 0 - 32 IU/L _2 Lab Results  Component Value Date   WBC 5.7 06/25/2017   HGB 14.5 06/25/2017   HCT 43.6 06/25/2017   MCV 86.5 06/25/2017   PLT 226 06/25/2017   NEUTROABS 3.7 06/25/2017    ASSESSMENT & PLAN:  Breast cancer of upper-outer quadrant of left female breast ([place]) RIGHT breast biopsy: 12:30: Invasive lobular carcinoma grade 1, ER 95%, PR 0% insufficient for her to our Ki-67; LCIS with necrosis and calcifications; LEFT breast biopsy upper outer quadrant: DCIS with necrosis and calcifications, ER 100%, PR 80% Right lumpectomy 02/23/2015: ALH, LCIS, 0/1 lymph node Left lumpectomy: DCIS ER PR positive Pathological staging: T1a N0 stage IA Adjuvant radiation therapy completed 05/25/2015  Current treatment: Adjuvant anastrozole 1 mg daily 5 years started 06/24/2015 stopped May 2022  We talked about risks and benefits of continuing anastrozole therapy for 2 more years.  Patient is very clear that she wants to stop it at this time.  This is not because of any adverse effects or toxicities.  She just does not want to continue it any further.  Scleroderma: Patient is currently on methotrexate and Plaquenil.  Surveillance: 1.Breast exam 03/21/2020: Benign 2.mammogram12/31/21: Benign at the [facility] in [place]: Benign  Patienthas retired and moved in with her daughter in [place]. She has a lot of time for exercises and spends time in [employer]. Return to clinic on an as-needed basis     No orders of the defined types were placed in this encounter.  The patient has a good understanding of the overall plan. she agrees with it. she will call with any problems that may  develop before the next visit here.  Total time spent: 20 mins including face to face time and time spent for planning, charting and coordination of care  Rulon Eisenmenger, MD, MPH 03/21/2020  I, Cloyde Reams Dorshimer, am acting as scribe for Dr. Nicholas Lose.  I have reviewed the above documentation for accuracy and completeness, and I agree with the above.

## 2020-03-20 NOTE — Assessment & Plan Note (Signed)
RIGHT breast biopsy: 12:30: Invasive lobular carcinoma grade 1, ER 95%, PR 0% insufficient for her to our Ki-67; LCIS with necrosis and calcifications; LEFT breast biopsy upper outer quadrant: DCIS with necrosis and calcifications, ER 100%, PR 80% Right lumpectomy 02/23/2015: [facility], LCIS, 0/1 lymph node Left lumpectomy: DCIS ER PR positive Pathological staging: T1a N0 stage IA Adjuvant radiation therapy completed 05/25/2015  Current treatment: Adjuvant anastrozole 1 mg daily 5 years started 06/24/2015  Anastrozole toxicities: 1. Intermittent hot flashes: Stable 2. musculoskeletal stiffness: Improved  Scleroderma: Patient is currently on methotrexate and Plaquenil.  Surveillance: 1.Breast exam 03/21/2020: Benign 2.mammogram12/31/21: Benign at the [facility] in [place]: Benign  Patienthas retired and moved in with her daughter in [place]. She has a lot of time for exercises and spends time in [employer]. Return to clinic in1 yearfor follow up

## 2020-03-21 ENCOUNTER — Inpatient Hospital Stay: Payer: Medicare HMO | Attending: Hematology and Oncology | Admitting: Hematology and Oncology

## 2020-03-21 ENCOUNTER — Other Ambulatory Visit: Payer: Self-pay

## 2020-03-21 DIAGNOSIS — Z79811 Long term (current) use of aromatase inhibitors: Secondary | ICD-10-CM | POA: Diagnosis not present

## 2020-03-21 DIAGNOSIS — Z170 Estrogen receptor positive status [ER+]: Secondary | ICD-10-CM

## 2020-03-21 DIAGNOSIS — C50412 Malignant neoplasm of upper-outer quadrant of left female breast: Secondary | ICD-10-CM | POA: Diagnosis present

## 2020-03-21 DIAGNOSIS — Z79899 Other long term (current) drug therapy: Secondary | ICD-10-CM | POA: Diagnosis not present

## 2020-03-21 DIAGNOSIS — M349 Systemic sclerosis, unspecified: Secondary | ICD-10-CM | POA: Diagnosis not present

## 2020-03-21 DIAGNOSIS — C50211 Malignant neoplasm of upper-inner quadrant of right female breast: Secondary | ICD-10-CM | POA: Insufficient documentation

## 2020-03-21 DIAGNOSIS — Z923 Personal history of irradiation: Secondary | ICD-10-CM | POA: Insufficient documentation

## 2020-03-22 ENCOUNTER — Telehealth: Payer: Self-pay | Admitting: Hematology and Oncology

## 2020-03-22 NOTE — Telephone Encounter (Signed)
No 2/22 los. No changes made to pt's schedule.  

## 2020-03-29 ENCOUNTER — Other Ambulatory Visit: Payer: Self-pay | Admitting: Hematology and Oncology
